# Patient Record
Sex: Female | Born: 1955 | Hispanic: Yes | Marital: Married | Smoking: Former smoker
Health system: Southern US, Community
[De-identification: ages and names within clinical notes are randomized; demographics above are authoritative.]

## PROBLEM LIST (undated history)

## (undated) DIAGNOSIS — K219 Gastro-esophageal reflux disease without esophagitis: Secondary | ICD-10-CM

## (undated) DIAGNOSIS — T7840XA Allergy, unspecified, initial encounter: Secondary | ICD-10-CM

## (undated) DIAGNOSIS — F419 Anxiety disorder, unspecified: Secondary | ICD-10-CM

## (undated) DIAGNOSIS — J45909 Unspecified asthma, uncomplicated: Secondary | ICD-10-CM

## (undated) DIAGNOSIS — I10 Essential (primary) hypertension: Secondary | ICD-10-CM

## (undated) HISTORY — DX: Allergy, unspecified, initial encounter: T78.40XA

## (undated) HISTORY — PX: TONSILLECTOMY: SUR1361

## (undated) HISTORY — PX: APPENDECTOMY: SHX54

## (undated) HISTORY — PX: HYSTERECTOMY ABDOMINAL WITH SALPINGECTOMY: SHX6725

## (undated) HISTORY — DX: Gastro-esophageal reflux disease without esophagitis: K21.9

## (undated) HISTORY — DX: Unspecified asthma, uncomplicated: J45.909

---

## 2018-03-16 LAB — HM COLONOSCOPY

## 2018-05-11 DIAGNOSIS — D649 Anemia, unspecified: Secondary | ICD-10-CM | POA: Insufficient documentation

## 2018-05-11 DIAGNOSIS — G4700 Insomnia, unspecified: Secondary | ICD-10-CM | POA: Insufficient documentation

## 2018-05-11 DIAGNOSIS — I10 Essential (primary) hypertension: Secondary | ICD-10-CM | POA: Insufficient documentation

## 2018-05-11 DIAGNOSIS — K8500 Idiopathic acute pancreatitis without necrosis or infection: Secondary | ICD-10-CM | POA: Insufficient documentation

## 2020-06-12 ENCOUNTER — Other Ambulatory Visit: Payer: Self-pay | Admitting: Physician Assistant

## 2020-06-12 DIAGNOSIS — Z8719 Personal history of other diseases of the digestive system: Secondary | ICD-10-CM

## 2020-06-25 ENCOUNTER — Ambulatory Visit
Admission: RE | Admit: 2020-06-25 | Discharge: 2020-06-25 | Disposition: A | Discharge status: Home / Self Care | Payer: 59 | Source: Ambulatory Visit | Attending: Physician Assistant | Admitting: Physician Assistant

## 2020-06-25 DIAGNOSIS — Z8719 Personal history of other diseases of the digestive system: Secondary | ICD-10-CM

## 2020-06-25 MED ORDER — IOPAMIDOL (ISOVUE-300) INJECTION 61%
100.0000 mL | Freq: Once | INTRAVENOUS | Status: AC | PRN
  Administered 2020-06-25: 100 mL via INTRAVENOUS

## 2020-12-10 ENCOUNTER — Other Ambulatory Visit: Payer: Self-pay

## 2020-12-10 ENCOUNTER — Encounter (HOSPITAL_COMMUNITY): Payer: Self-pay

## 2020-12-10 ENCOUNTER — Ambulatory Visit (HOSPITAL_COMMUNITY)
Admission: EM | Admit: 2020-12-10 | Discharge: 2020-12-10 | Disposition: A | Discharge status: Home / Self Care | Payer: 59 | Attending: Family Medicine | Admitting: Family Medicine

## 2020-12-10 DIAGNOSIS — I10 Essential (primary) hypertension: Secondary | ICD-10-CM

## 2020-12-10 DIAGNOSIS — Z760 Encounter for issue of repeat prescription: Secondary | ICD-10-CM

## 2020-12-10 HISTORY — DX: Anxiety disorder, unspecified: F41.9

## 2020-12-10 HISTORY — DX: Essential (primary) hypertension: I10

## 2020-12-10 MED ORDER — LOSARTAN POTASSIUM-HCTZ 50-12.5 MG PO TABS: 1.0000 | ORAL_TABLET | Freq: Every day | ORAL | 1 refills | Status: DC

## 2020-12-10 NOTE — ED Triage Notes (Signed)
Pt presents for medication refill:   -Losartan-hydrochlorothiazide 50-12.5 mg   Pt is looking for a PCP.

## 2020-12-10 NOTE — ED Provider Notes (Addendum)
[facility]    CSN: [id number] Arrival date & time: 12/10/20  1340      History   Chief Complaint Chief Complaint  Patient presents with  . Medication Refill    HPI Cheryl Weber is a 65 y.o. female.   HPI  Patient is here for medicine refill.  She is on hydrochlorothiazide, losartan.  This works well for her.  Her blood pressure has been well controlled..  She feels well.  No headache or dizzy spells.  No visual symptoms.  No chest pain or heart disease.  She would also like the name of primary care Dr.  taking new patients. Patient is Spanish-speaking.  Seen with Spanish interpreter   Past Medical History:  Diagnosis Date  . Anxiety   . Hypertension     There are no problems to display for this patient.   Past Surgical History:  Procedure Laterality Date  . APPENDECTOMY    . CESAREAN SECTION    . HYSTERECTOMY ABDOMINAL WITH SALPINGECTOMY    . TONSILLECTOMY      OB History    Gravida  3   Para  3   Term  3   Preterm      AB  0   Living  3     SAB      IAB      Ectopic      Multiple      Live Births               Home Medications    Prior to Admission medications   Medication Sig Start Date End Date Taking? Authorizing Provider  sertraline (ZOLOFT) 25 MG tablet Take by mouth. 10/15/20 10/15/21 Yes [provider]  losartan-hydrochlorothiazide (HYZAAR) 50-12.5 MG tablet Take 1 tablet by mouth daily. 12/10/20 02/08/21  Eustace Moore, MD    Family History History reviewed. No pertinent family history.  Social History Social History   Tobacco Use  . Smoking status: Former Smoker    Quit date: 12/10/1992    Years since quitting: 28.0  . Smokeless tobacco: Never Used  Substance Use Topics  . Alcohol use: Yes  . Drug use: Never     Allergies   Lactase   Review of Systems Review of Systems  See HPI Physical Exam Triage Vital Signs   BP (!) 169/85 (BP Location: Right Arm)   Pulse 67    Temp 97.7 F (36.5 C) (Oral)   Resp 16   SpO2 98%       Physical Exam Constitutional:      General: She is not in acute distress.    Appearance: Normal appearance. She is well-developed, normal weight and well-nourished.  HENT:     Head: Normocephalic and atraumatic.     Mouth/Throat:     Mouth: Oropharynx is clear and moist.     Comments: Mask is in place Eyes:     Conjunctiva/sclera: Conjunctivae normal.     Pupils: Pupils are equal, round, and reactive to light.  Cardiovascular:     Rate and Rhythm: Normal rate and regular rhythm.     Heart sounds: Normal heart sounds.  Pulmonary:     Effort: Pulmonary effort is normal. No respiratory distress.     Breath sounds: Normal breath sounds.  Abdominal:     General: There is no distension.     Palpations: Abdomen is soft.  Musculoskeletal:        General: No edema. Normal range  of motion.     Cervical back: Normal range of motion.  Skin:    General: Skin is warm and dry.  Neurological:     Mental Status: She is alert.  Psychiatric:        Behavior: Behavior normal.      UC Treatments / Results  Labs (all labs ordered are listed, but only abnormal results are displayed) Labs Reviewed - No data to display  EKG   Radiology No results found.  Procedures Procedures (including critical care time)  Medications Ordered in UC Medications - No data to display  Initial Impression / Assessment and Plan / UC Course  I have reviewed the triage vital signs and the nursing notes.  Pertinent labs & imaging results that were available during my care of the patient were reviewed by me and considered in my medical decision making (see chart for details).     Medicine is refilled.  Referral of her PCP is given.  Return as needed Final Clinical Impressions(s) / UC Diagnoses   Final diagnoses:  Essential hypertension  Encounter for medication refill     Discharge Instructions     Take blood pressure medicine 1 pill a  day  Call internal medicine or family practice for a primary care doctor    ED Prescriptions    Medication Sig Dispense Auth. Provider   losartan-hydrochlorothiazide (HYZAAR) 50-12.5 MG tablet Take 1 tablet by mouth daily. 30 tablet Eustace Moore, MD     PDMP not reviewed this encounter.   Eustace Moore, MD 12/10/20 1724    Eustace Moore, MD 12/10/20 [phone]

## 2020-12-10 NOTE — Discharge Instructions (Addendum)
Take blood pressure medicine 1 pill a day  Call internal medicine or family practice for a primary care doctor

## 2021-01-28 ENCOUNTER — Other Ambulatory Visit: Payer: Self-pay

## 2021-01-28 ENCOUNTER — Encounter: Payer: Self-pay | Admitting: Physician Assistant

## 2021-01-28 ENCOUNTER — Ambulatory Visit (INDEPENDENT_AMBULATORY_CARE_PROVIDER_SITE_OTHER): Payer: 59

## 2021-01-28 ENCOUNTER — Ambulatory Visit (INDEPENDENT_AMBULATORY_CARE_PROVIDER_SITE_OTHER): Payer: 59 | Admitting: Physician Assistant

## 2021-01-28 VITALS — BP 150/100 | HR 75 | Temp 97.6°F | Ht 65.0 in | Wt 171.4 lb

## 2021-01-28 DIAGNOSIS — D649 Anemia, unspecified: Secondary | ICD-10-CM

## 2021-01-28 DIAGNOSIS — R918 Other nonspecific abnormal finding of lung field: Secondary | ICD-10-CM | POA: Diagnosis not present

## 2021-01-28 DIAGNOSIS — K219 Gastro-esophageal reflux disease without esophagitis: Secondary | ICD-10-CM | POA: Insufficient documentation

## 2021-01-28 DIAGNOSIS — R059 Cough, unspecified: Secondary | ICD-10-CM

## 2021-01-28 DIAGNOSIS — J4520 Mild intermittent asthma, uncomplicated: Secondary | ICD-10-CM | POA: Diagnosis not present

## 2021-01-28 DIAGNOSIS — J4540 Moderate persistent asthma, uncomplicated: Secondary | ICD-10-CM

## 2021-01-28 DIAGNOSIS — F419 Anxiety disorder, unspecified: Secondary | ICD-10-CM | POA: Diagnosis not present

## 2021-01-28 DIAGNOSIS — Z8619 Personal history of other infectious and parasitic diseases: Secondary | ICD-10-CM | POA: Insufficient documentation

## 2021-01-28 DIAGNOSIS — I10 Essential (primary) hypertension: Secondary | ICD-10-CM

## 2021-01-28 LAB — CBC WITH DIFFERENTIAL/PLATELET
Basophils Absolute: 0 10*3/uL (ref 0.0–0.1)
Basophils Relative: 0.7 % (ref 0.0–3.0)
Eosinophils Absolute: 0.1 10*3/uL (ref 0.0–0.7)
Eosinophils Relative: 1.3 % (ref 0.0–5.0)
HCT: 35.5 % — ABNORMAL LOW (ref 36.0–46.0)
Hemoglobin: 11.9 g/dL — ABNORMAL LOW (ref 12.0–15.0)
Lymphocytes Relative: 43.6 % (ref 12.0–46.0)
Lymphs Abs: 2.4 10*3/uL (ref 0.7–4.0)
MCHC: 33.5 g/dL (ref 30.0–36.0)
MCV: 78 fl (ref 78.0–100.0)
Monocytes Absolute: 0.7 10*3/uL (ref 0.1–1.0)
Monocytes Relative: 12.4 % — ABNORMAL HIGH (ref 3.0–12.0)
Neutro Abs: 2.3 10*3/uL (ref 1.4–7.7)
Neutrophils Relative %: 42 % — ABNORMAL LOW (ref 43.0–77.0)
Platelets: 318 10*3/uL (ref 150.0–400.0)
RBC: 4.55 Mil/uL (ref 3.87–5.11)
RDW: 15 % (ref 11.5–15.5)
WBC: 5.5 10*3/uL (ref 4.0–10.5)

## 2021-01-28 LAB — LIPID PANEL
Cholesterol: 241 mg/dL — ABNORMAL HIGH (ref 0–200)
HDL: 80.1 mg/dL (ref >39.00)
LDL Cholesterol: 136 mg/dL — ABNORMAL HIGH (ref 0–99)
NonHDL: 160.51
Total CHOL/HDL Ratio: 3
Triglycerides: 122 mg/dL (ref 0.0–149.0)
VLDL: 24.4 mg/dL (ref 0.0–40.0)

## 2021-01-28 LAB — COMPREHENSIVE METABOLIC PANEL
ALT: 14 U/L (ref 0–35)
AST: 20 U/L (ref 0–37)
Albumin: 4.3 g/dL (ref 3.5–5.2)
Alkaline Phosphatase: 105 U/L (ref 39–117)
BUN: 10 mg/dL (ref 6–23)
CO2: 28 mEq/L (ref 19–32)
Calcium: 9.7 mg/dL (ref 8.4–10.5)
Chloride: 96 mEq/L (ref 96–112)
Creatinine, Ser: 0.63 mg/dL (ref 0.40–1.20)
GFR: 94.24 mL/min (ref >60.00)
Glucose, Bld: 79 mg/dL (ref 70–99)
Potassium: 3.6 mEq/L (ref 3.5–5.1)
Sodium: 131 mEq/L — ABNORMAL LOW (ref 135–145)
Total Bilirubin: 0.4 mg/dL (ref 0.2–1.2)
Total Protein: 7.5 g/dL (ref 6.0–8.3)

## 2021-01-28 LAB — VITAMIN B12: Vitamin B-12: 237 pg/mL (ref 211–911)

## 2021-01-28 LAB — IRON: Iron: 88 ug/dL (ref 42–145)

## 2021-01-28 LAB — FERRITIN: Ferritin: 41.3 ng/mL (ref 10.0–291.0)

## 2021-01-28 MED ORDER — LOSARTAN POTASSIUM 50 MG PO TABS: 50.0000 mg | ORAL_TABLET | Freq: Every day | ORAL | 1 refills | Status: DC

## 2021-01-28 MED ORDER — HYDROCHLOROTHIAZIDE 12.5 MG PO TABS: 12.5000 mg | ORAL_TABLET | Freq: Every day | ORAL | 1 refills | Status: DC

## 2021-01-28 NOTE — Progress Notes (Signed)
Cheryl Weber de [name] is a 65 y.o. female is here to establish care.  I acted as a Neurosurgeon for [employer], PA-C Corky Mull, LPN   History of Present Illness:   Chief Complaint  Patient presents with  . Establish Care  . Asthma    HPI  Pt is here to establish care today. She is with Nile Riggs, in-person interpreter.  Asthma; Lung Nodule; Cough Pt has hx of asthma as a child. Presently not having any issues. Pt says when she is sleeping she has been coughing a lot at night for the past 4 months. Dry cough. No unintentional weight loss, is losing weight due to increased activity. Lost about 10 lb in two months. In the DR, a long time ago she was told that she had a spot on one of her lungs. Has not had any imaging of this anytime recently or documentation of this. Negative TB test in the past. Would have chest xrays for this, denies every having a CT scan, 2016 was the last time this was evaluated. She is not having night sweats or coughing up blood. Does have hx of reflux, and takes prilosec as needed. Has an albuterol inhaler that she uses at night prn.  Smoked for 5 years, about 1 cigarette per day, quit in 1987.  Takes zyrtec 10 mg only prn.   HTN Currently taking Losartan 50 mg and HCTZ 12.5 mg. She does have a blood pressure monitor at home but doesn't use it.  Patient denies chest pain, SOB, blurred vision, dizziness, unusual headaches, lower leg swelling. Patient is compliant with medication. Denies excessive caffeine intake, stimulant usage, excessive alcohol intake, or increase in salt consumption.  BP Readings from Last 3 Encounters:  01/28/21 (!) 150/100  12/10/20 (!) 169/85    Anxiety Ran out of her zoloft 25 mg about 3 months ago. She feels like her symptoms were pretty situational and she does not need this medication at this time. Denies SI/HI.   Anemia Reports hx of iron deficiency. Was instructed to take oral iron regular and she takes this  (Floradix) liquid supplementation.     Health Maintenance Due  Topic Date Due  . Hepatitis C Screening  Never done  . HIV Screening  Never done  . TETANUS/TDAP  Never done  . PAP SMEAR-Modifier  Never done  . COLONOSCOPY (Pts 45-61yrs Insurance coverage will need to be confirmed)  Never done  . MAMMOGRAM  Never done  . INFLUENZA VACCINE  07/01/2020    Past Medical History:  Diagnosis Date  . Allergy   . Anxiety   . Asthma   . GERD (gastroesophageal reflux disease)   . Hypertension   . Vaginal delivery 1968, 1981     Social History   Tobacco Use  . Smoking status: Former Games developer  . Smokeless tobacco: Never Used  . Tobacco comment: quit 1987  Vaping Use  . Vaping Use: Never used  Substance Use Topics  . Alcohol use: Yes    Alcohol/week: 4.0 - 5.0 standard drinks    Types: 4 - 5 Glasses of wine per week  . Drug use: Never    Past Surgical History:  Procedure Laterality Date  . APPENDECTOMY    . CESAREAN SECTION  1985  . HYSTERECTOMY ABDOMINAL WITH SALPINGECTOMY    . TONSILLECTOMY      Family History  Problem Relation Age of Onset  . Lung cancer Father   . Cirrhosis Father   .  Alcohol abuse Father     PMHx, SurgHx, SocialHx, FamHx, Medications, and Allergies were reviewed in the Visit Navigator and updated as appropriate.   Patient Active Problem List   Diagnosis Date Noted  . Gastroesophageal reflux disease 01/28/2021  . History of Helicobacter pylori infection 01/28/2021  . Anemia 05/11/2018  . Hypertension 05/11/2018  . Idiopathic acute pancreatitis 05/11/2018  . Insomnia 05/11/2018    Social History   Tobacco Use  . Smoking status: Former Games developer  . Smokeless tobacco: Never Used  . Tobacco comment: quit 1987  Vaping Use  . Vaping Use: Never used  Substance Use Topics  . Alcohol use: Yes    Alcohol/week: 4.0 - 5.0 standard drinks    Types: 4 - 5 Glasses of wine per week  . Drug use: Never    Current Medications and Allergies:     Current Outpatient Medications:  .  albuterol (VENTOLIN HFA) 108 (90 Base) MCG/ACT inhaler, 2 puffs, Disp: , Rfl:  .  cetirizine (ZYRTEC) 10 MG tablet, 1 tablet, Disp: , Rfl:  .  Cholecalciferol (VITAMIN D3) 50 MCG (2000 UT) CAPS, Take 1 capsule by mouth daily in the afternoon., Disp: , Rfl:  .  Doxylamine Succinate, Sleep, (SLEEP AID PO), Take 1 tablet by mouth at bedtime as needed., Disp: , Rfl:  .  Magnesium 500 MG CAPS, Take 1 capsule by mouth daily in the afternoon., Disp: , Rfl:  .  MELATONIN PO, Take 1 capsule by mouth at bedtime as needed., Disp: , Rfl:  .  omeprazole (PRILOSEC) 20 MG capsule, Take by mouth., Disp: , Rfl:  .  hydrochlorothiazide (HYDRODIURIL) 12.5 MG tablet, Take 1 tablet (12.5 mg total) by mouth daily., Disp: 90 tablet, Rfl: 1 .  losartan (COZAAR) 50 MG tablet, Take 1 tablet (50 mg total) by mouth daily., Disp: 90 tablet, Rfl: 1   Allergies  Allergen Reactions  . Gluten Meal Other (See Comments)  . Lactase Diarrhea  . Other Other (See Comments)  . Pork Allergy Diarrhea    Review of Systems   ROS Negative unless otherwise specified per HPI.  Vitals:   Vitals:   01/28/21 1036  BP: (!) 150/100  Pulse: 75  Temp: 97.6 F (36.4 C)  TempSrc: Temporal  SpO2: 97%  Weight: 171 lb 6.1 oz (77.7 kg)  Height: 5\' 5"  (1.651 m)     Body mass index is 28.52 kg/m.   Physical Exam:    Physical Exam Vitals and nursing note reviewed.  Constitutional:      General: She is not in acute distress.    Appearance: She is well-developed. She is not ill-appearing, toxic-appearing or sickly-appearing.  Cardiovascular:     Rate and Rhythm: Normal rate and regular rhythm.     Pulses: Normal pulses.     Heart sounds: Normal heart sounds, S1 normal and S2 normal.     Comments: No LE edema Pulmonary:     Effort: Pulmonary effort is normal.     Breath sounds: Normal breath sounds.  Skin:    General: Skin is warm, dry and intact.  Neurological:     Mental Status:  She is alert.     GCS: GCS eye subscore is 4. GCS verbal subscore is 5. GCS motor subscore is 6.  Psychiatric:        Mood and Affect: Mood and affect normal.        Speech: Speech normal.        Behavior: Behavior normal. Behavior is cooperative.  Assessment and Plan:    Carmie was seen today for establish care and asthma.  Diagnoses and all orders for this visit:  Primary hypertension Above goal in office today. Will continue regimen and have her keep a log for [place] as I have no other readings on her and she states that she also has hx of hypotension. Refill losartan 50 mg and hctz 12.5 mg daily. Follow-up in 1 month, sooner if concerns. -     Comprehensive metabolic panel -     Lipid panel  Anxiety Well controlled. No needs identified at this time. Follow-up as needed.  Mild intermittent asthma without complication Taking albuterol prn. Follow-up as needed. -     DG Chest 2 View; Future  X-ray of lung, abnormal; Cough No red flags on discussion. Update blood work today and also xray per patient request. I do not have records of prior lung abnormalities so I do not have baseline to compare this to. If imaging WNL, consider daily priliosec and zyrtec to see if this helps her symptoms vs referral to pulm.  Anemia, unspecified type Update blood work today and further evaluate. -     CBC with Differential/Platelet -     Vitamin B12 -     Ferritin -     Iron  Other orders -     losartan (COZAAR) 50 MG tablet; Take 1 tablet (50 mg total) by mouth daily. -     hydrochlorothiazide (HYDRODIURIL) 12.5 MG tablet; Take 1 tablet (12.5 mg total) by mouth daily.   CMA or LPN served as scribe during this visit. History, Physical, and Plan performed by medical provider. The above documentation has been reviewed and is accurate and complete.  Time spent with patient today was 45 minutes which consisted of chart review, discussing diagnosis, work up, treatment answering  questions and documentation.   Jarold Motto, PA-C [place], [place] 01/28/2021  Follow-up: No follow-ups on file.

## 2021-01-28 NOTE — Patient Instructions (Addendum)
It was great to see you!  For your blood pressure: Please record your blood pressure a few days a week for [place]. No changes today. Continue taking them at this time. Follow-up in 1 month so we can review your blood pressures with you and check again in the office.  For your anxiety: I agree with holding your zoloft (sertraline for now)  For your cough: Xray today  For your anemia: We will update your blood work and provide feedback after blood work has returned. You may continue your over the counter supplement for now.  Let's follow-up in 1 month, sooner if you have concerns.  If a referral was placed today, you will be contacted for an appointment. Please note that routine referrals can sometimes take up to 3-4 weeks to process. Please call our office if you haven't heard anything after this time frame.  Take care,  Jarold Motto PA-C    Cmo tomarse la presin arterial How to Take Your Blood Pressure La presin arterial es la medida de la fuerza de la sangre al presionar contra las paredes de las arterias. Las arterias son los vasos sanguneos que transportan la sangre desde el corazn hacia todas las partes del cuerpo. Usted puede tomar su presin arterial en casa con un aparato. Es posible que tenga que tomar su presin arterial en casa:  Para ver si tiene presin arterial elevada (hipertensin).  Para controlar su presin arterial a lo largo del tiempo.  Para asegurarse de que el medicamento para la presin arterial est surtiendo Engineer, mining. Materiales necesarios:  Aparato de medicin de la presin arterial o tensimetro.  Silla de comedor para sentarse.  Mesa o escritorio.  Cuaderno pequeo.  Lpiz o bolgrafo. Cmo prepararse Evite realizar lo siguiente durante los 30 minutos anteriores a Chief Operating Officer su presin arterial:  Consumir bebidas con cafena, como caf o t.  Consumir alcohol.  Comer.  Fumar.  Realizar actividad fsica. Haga lo siguiente cinco  minutos antes de controlarse la presin arterial:  Vaya al bao y haga pis (orine).  Sintese en una silla de comedor. No se siente en un silln blando o sof.  Est tranquilo. No hable. Cmo tomarse la presin arterial Siga las instrucciones que vienen con el aparato. Si tiene Ambulance person, las instrucciones podran ser las siguientes: 1. Sintese con la espalda recta. 2. Coloque los pies en el piso. No cruce los tobillos ni las piernas. 3. Apoye el brazo izquierdo al nivel del corazn. Puede apoyarlo en una mesa, escritorio o silla. 4. Arremnguese. 5. Envuelva la parte superior de su brazo izquierdo con el brazalete. El brazalete debe estar 1 pulgada (2,5 cm) sobre su codo. Es mejor Optometrist brazalete alrededor de la piel [place]. 6. Ajuste el brazalete alrededor de su brazo. Debe poder meter nicamente un dedo entre el brazalete y Cabin crew. 7. Coloque el cordn de modo que quede apoyado en el pliegue del codo. 8. Presione el botn de encendido. 9. Qudese sentado tranquilamente mientras el brazalete se infla y se desinfla. 10. Escriba los nmeros que se muestran en la pantalla. 11. Espere 2o 3 minutos y repita los pasos 1al10.   Qu significan los nmeros? Dos nmeros conforman la presin arterial. El primer nmero es la presin sistlica. El segundo nmero es la presin diastlica. Un ejemplo de lectura de presin arterial sera "120 sobre 80" (o 120/80). Si es adulto y no tiene Therapist, music, use esta gua para saber si su presin arterial es normal: Normal  Primer nmero: debajo de 120.  Segundo nmero: debajo de 80. Elevada  Primer nmero: 120-129.  Segundo nmero: debajo de 80. Etapa 1 de hipertensin  Primer nmero: 130-139.  Segundo nmero: 80-89. Etapa 2 de hipertensin  Primer nmero: 140 o ms.  Segundo nmero: 90 o ms. Su presin arterial se encuentra por encima del nivel normal incluso si nicamente el nmero superior o el inferior es  mayor de lo normal. Siga estas instrucciones en su casa:  Controle su presin arterial con la frecuencia que le indique su mdico.  Contrlese la presin arterial a la misma hora todos los [place].  Lleve el tensimetro a su prxima cita con el mdico. Su mdico: ? Se asegurar de que lo est usando correctamente. ? Se asegurar de que funcione bien.  Se asegurar de que entienda cules deben ser sus nmeros de presin arterial.  Dgale al mdico si los medicamentos que toma le causan efectos secundarios.  Concurra a todas las visitas de [address] se lo haya indicado el mdico. Esto es importante. Consejos generales:  Necesitar un aparato de medicin de la presin arterial o tensimetro. Su mdico puede sugerirle un tensimetro. Puede comprar uno en una farmacia o en lnea. Al escoger uno: ? Escoja uno que tenga un brazalete. ? Escoja uno que se envuelva en la parte superior de su brazo. Debe poder meter nicamente un dedo entre el brazalete y Cabin crew. ? No escoja uno que mida su presin arterial en la mueca o el dedo. Dnde buscar ms informacin American Heart Association (Asociacin Estadounidense del Corazn): www.heart.org Comunquese con un mdico si:  Su presin arterial sigue alta. Solicite ayuda de inmediato si:  Su primer nmero de presin arterial es ms alto que 180.  Su segundo nmero de presin arterial es ms alto que 120. Resumen  Contrlese la presin arterial a la [employer].  Evite la cafena, el alcohol, fumar y hacer actividad fsica durante los 30 minutos anteriores a controlarse la presin arterial.  Se asegurar de que entienda cules deben ser sus nmeros de presin arterial. Esta informacin no tiene Theme park manager el consejo del mdico. Asegrese de hacerle al mdico cualquier pregunta que tenga. Document Revised: 12/26/2019 Document Reviewed: 12/26/2019 Elsevier Patient Education  2021 [employer].

## 2021-01-29 ENCOUNTER — Encounter: Payer: Self-pay | Admitting: Physician Assistant

## 2021-02-01 ENCOUNTER — Other Ambulatory Visit: Payer: Self-pay | Admitting: *Deleted

## 2021-02-01 ENCOUNTER — Encounter: Payer: Self-pay | Admitting: Physician Assistant

## 2021-02-01 MED ORDER — LOSARTAN POTASSIUM 100 MG PO TABS: 100.0000 mg | ORAL_TABLET | Freq: Every day | ORAL | 1 refills | Status: DC

## 2021-02-11 ENCOUNTER — Ambulatory Visit: Payer: 59 | Admitting: Physician Assistant

## 2021-02-26 ENCOUNTER — Other Ambulatory Visit: Payer: Self-pay

## 2021-02-26 ENCOUNTER — Ambulatory Visit (INDEPENDENT_AMBULATORY_CARE_PROVIDER_SITE_OTHER): Payer: 59 | Admitting: Physician Assistant

## 2021-02-26 ENCOUNTER — Encounter: Payer: Self-pay | Admitting: Physician Assistant

## 2021-02-26 VITALS — BP 142/86 | HR 74 | Temp 97.7°F | Ht 67.0 in | Wt 172.2 lb

## 2021-02-26 DIAGNOSIS — I10 Essential (primary) hypertension: Secondary | ICD-10-CM | POA: Diagnosis not present

## 2021-02-26 LAB — BASIC METABOLIC PANEL
BUN: 14 mg/dL (ref 6–23)
CO2: 30 mEq/L (ref 19–32)
Calcium: 9.8 mg/dL (ref 8.4–10.5)
Chloride: 98 mEq/L (ref 96–112)
Creatinine, Ser: 0.64 mg/dL (ref 0.40–1.20)
GFR: 93.83 mL/min (ref >60.00)
Glucose, Bld: 78 mg/dL (ref 70–99)
Potassium: 3.9 mEq/L (ref 3.5–5.1)
Sodium: 134 mEq/L — ABNORMAL LOW (ref 135–145)

## 2021-02-26 MED ORDER — AMLODIPINE BESYLATE 5 MG PO TABS: 5.0000 mg | ORAL_TABLET | Freq: Every day | ORAL | 1 refills | Status: DC

## 2021-02-26 NOTE — Progress Notes (Signed)
Cheryl Weber de Cheryl Weber is a 65 y.o. female here for a follow up of a pre-existing problem.  History of Present Illness:   Chief Complaint  Patient presents with  . Hypertension    Has taken medication today, did not check blood pressure today, Brought readings with her, are not daily readings.    HPI   She is with in person interpreter today.  HTN Currently taking Losartan 100 mg daily. We stopped HCTZ due to low sodium. At home blood pressure readings are: 140-150/80-90. Patient denies chest pain, SOB, blurred vision, dizziness, unusual headaches, lower leg swelling. Patient is compliant with medication. Denies excessive caffeine intake, stimulant usage, excessive alcohol intake, or increase in salt consumption.  BP Readings from Last 3 Encounters:  02/26/21 (!) 142/86  01/28/21 (!) 150/100  12/10/20 (!) 169/85   Wt Readings from Last 5 Encounters:  02/26/21 172 lb 3.2 oz (78.1 kg)  01/28/21 171 lb 6.1 oz (77.7 kg)     Past Medical History:  Diagnosis Date  . Allergy   . Anxiety   . Asthma   . GERD (gastroesophageal reflux disease)   . Hypertension   . Vaginal delivery 1968, 1981     Social History   Tobacco Use  . Smoking status: Former Games developer  . Smokeless tobacco: Never Used  . Tobacco comment: quit 1987  Vaping Use  . Vaping Use: Never used  Substance Use Topics  . Alcohol use: Yes    Alcohol/week: 4.0 - 5.0 standard drinks    Types: 4 - 5 Glasses of wine per week  . Drug use: Never    Past Surgical History:  Procedure Laterality Date  . APPENDECTOMY    . CESAREAN SECTION  1985  . HYSTERECTOMY ABDOMINAL WITH SALPINGECTOMY    . TONSILLECTOMY      Family History  Problem Relation Age of Onset  . Lung cancer Father   . Cirrhosis Father   . Alcohol abuse Father     Allergies  Allergen Reactions  . Gluten Meal Other (See Comments)  . Lactase Diarrhea  . Other Other (See Comments)  . Pork Allergy Diarrhea    Current Medications:    Current Outpatient Medications:  .  albuterol (VENTOLIN HFA) 108 (90 Base) MCG/ACT inhaler, 2 puffs, Disp: , Rfl:  .  amLODipine (NORVASC) 5 MG tablet, Take 1 tablet (5 mg total) by mouth daily., Disp: 30 tablet, Rfl: 1 .  cetirizine (ZYRTEC) 10 MG tablet, 1 tablet, Disp: , Rfl:  .  Cholecalciferol (VITAMIN D3) 50 MCG (2000 UT) CAPS, Take 1 capsule by mouth daily in the afternoon., Disp: , Rfl:  .  Doxylamine Succinate, Sleep, (SLEEP AID PO), Take 1 tablet by mouth at bedtime as needed., Disp: , Rfl:  .  losartan (COZAAR) 100 MG tablet, Take 1 tablet (100 mg total) by mouth daily., Disp: 90 tablet, Rfl: 1 .  Magnesium 500 MG CAPS, Take 1 capsule by mouth daily in the afternoon., Disp: , Rfl:  .  MELATONIN PO, Take 1 capsule by mouth at bedtime as needed., Disp: , Rfl:  .  omeprazole (PRILOSEC) 20 MG capsule, Take by mouth., Disp: , Rfl:  .  fluticasone (FLONASE) 50 MCG/ACT nasal spray, Place 1 spray into both nostrils daily., Disp: , Rfl:    Review of Systems:   ROS Negative unless otherwise specified per HPI.  Vitals:   Vitals:   02/26/21 1047  BP: (!) 142/86  Pulse: 74  Temp: 97.7 F (36.5 C)  TempSrc: Temporal  SpO2: 98%  Weight: 172 lb 3.2 oz (78.1 kg)  Height: 5\' 7"  (1.702 m)     Body mass index is 26.97 kg/m.  Physical Exam:   Physical Exam Vitals and nursing note reviewed.  Constitutional:      General: She is not in acute distress.    Appearance: She is well-developed. She is not ill-appearing or toxic-appearing.  Cardiovascular:     Rate and Rhythm: Normal rate and regular rhythm.     Pulses: Normal pulses.     Heart sounds: Normal heart sounds, S1 normal and S2 normal.     Comments: No LE edema Pulmonary:     Effort: Pulmonary effort is normal.     Breath sounds: Normal breath sounds.  Skin:    General: Skin is warm and dry.  Neurological:     Mental Status: She is alert.     GCS: GCS eye subscore is 4. GCS verbal subscore is 5. GCS motor subscore is  6.  Psychiatric:        Speech: Speech normal.        Behavior: Behavior normal. Behavior is cooperative.     Assessment and Plan:   Caliann was seen today for hypertension.  Diagnoses and all orders for this visit:  Primary hypertension Remains above goal Continue losartan 100 mg daily Add norvasc 5 mg daily Update blood work today to recheck sodium Recheck in 1 month, sooner if concerns -     Basic metabolic panel  Other orders -     amLODipine (NORVASC) 5 MG tablet; Take 1 tablet (5 mg total) by mouth daily.   Kandis Cocking, PA-C

## 2021-02-26 NOTE — Patient Instructions (Signed)
It was great to see you!  Please let me know after about a month how your blood pressure is doing with the new medication. You can call is to give [place] an update or schedule follow-up in 1 month.  Continue Losartan 100 mg daily. Start norvasc 5 mg daily.  Goal BP is <140/80  Take care,  Jarold Motto PA-C

## 2021-03-23 ENCOUNTER — Other Ambulatory Visit: Payer: Self-pay | Admitting: Physician Assistant

## 2021-04-02 ENCOUNTER — Encounter: Payer: Self-pay | Admitting: Physician Assistant

## 2021-04-02 ENCOUNTER — Other Ambulatory Visit: Payer: Self-pay

## 2021-04-02 ENCOUNTER — Ambulatory Visit: Payer: 59

## 2021-04-02 ENCOUNTER — Encounter: Payer: Self-pay | Admitting: *Deleted

## 2021-04-02 ENCOUNTER — Ambulatory Visit (INDEPENDENT_AMBULATORY_CARE_PROVIDER_SITE_OTHER): Payer: 59 | Admitting: Physician Assistant

## 2021-04-02 VITALS — BP 130/80 | HR 71 | Temp 97.8°F | Ht 67.0 in | Wt 167.4 lb

## 2021-04-02 DIAGNOSIS — I10 Essential (primary) hypertension: Secondary | ICD-10-CM | POA: Diagnosis not present

## 2021-04-02 DIAGNOSIS — R42 Dizziness and giddiness: Secondary | ICD-10-CM

## 2021-04-02 LAB — COMPREHENSIVE METABOLIC PANEL
ALT: 17 U/L (ref 0–35)
AST: 21 U/L (ref 0–37)
Albumin: 4.3 g/dL (ref 3.5–5.2)
Alkaline Phosphatase: 98 U/L (ref 39–117)
BUN: 14 mg/dL (ref 6–23)
CO2: 31 mEq/L (ref 19–32)
Calcium: 9.8 mg/dL (ref 8.4–10.5)
Chloride: 99 mEq/L (ref 96–112)
Creatinine, Ser: 0.67 mg/dL (ref 0.40–1.20)
GFR: 92.74 mL/min (ref >60.00)
Glucose, Bld: 69 mg/dL — ABNORMAL LOW (ref 70–99)
Potassium: 4.2 mEq/L (ref 3.5–5.1)
Sodium: 136 mEq/L (ref 135–145)
Total Bilirubin: 0.4 mg/dL (ref 0.2–1.2)
Total Protein: 6.9 g/dL (ref 6.0–8.3)

## 2021-04-02 LAB — HEMOGLOBIN A1C: Hgb A1c MFr Bld: 5.9 % (ref 4.6–6.5)

## 2021-04-02 NOTE — Patient Instructions (Signed)
It was great to see you!  EKG looks overall normal.  I'm going to update some blood work today.  We are going to order some more tests:  Echocardiogram (ultrasound of your heart)  Carotid ultrasound (ultrasound of your arteries in your neck)  Holter monitor (continuous heart monitor that you will wear for two weeks)  MRI of brain  Any worsening symptoms in the meantime, please go to the ER  You will be contacted about scheduling all of these orders.  Take care,  Jarold Motto PA-C

## 2021-04-02 NOTE — Progress Notes (Signed)
Cheryl Weber is a 65 y.o. female is here for follow up.  I acted as a Neurosurgeon for [employer], PA-C Corky Mull, LPN   History of Present Illness:   Chief Complaint  Patient presents with  . Hypertension   Patient is currently here with in-person interpreter.  HPI   Hypertension Currently taking Amlodipine 5 mg daily. Pt has been checking blood pressure at home, systolic 130-149, diastolic 75-89. Pt has not been taking Losartan misunderstood instructions. She has been having some sinus pressure headaches and some dizziness. Pt denies blurred vision, chest pain, SOB or lower leg edema. Denies excessive caffeine intake, stimulant usage, excessive alcohol intake or increase in salt consumption.  15 days after starting the amlodopine she has had some dizziness. At this time she also changed her magnesium supplement because she read that a different type of magnesium may help her heart. The dizziness is a a combination of slight spinning and feeling lightheaded. The episodes last a few seconds and they cause her to feel unstable, walk unbalanced and feel confused. This happened once a year ago and she ignored it, but now states that this is happening a few times each week over the past week.  Denies: chest pain, palpitations  Has family hx of CVA.  Health Maintenance Due  Topic Date Due  . Hepatitis C Screening  Never done  . HIV Screening  Never done  . TETANUS/TDAP  Never done  . PAP SMEAR-Modifier  Never done    Past Medical History:  Diagnosis Date  . Allergy   . Anxiety   . Asthma   . GERD (gastroesophageal reflux disease)   . Hypertension   . Vaginal delivery 1968, 1981     Social History   Tobacco Use  . Smoking status: Former Games developer  . Smokeless tobacco: Never Used  . Tobacco comment: quit 1987  Vaping Use  . Vaping Use: Never used  Substance Use Topics  . Alcohol use: Yes    Alcohol/week: 4.0 - 5.0 standard drinks    Types: 4 - 5  Glasses of wine per week  . Drug use: Never    Past Surgical History:  Procedure Laterality Date  . APPENDECTOMY    . CESAREAN SECTION  1985  . HYSTERECTOMY ABDOMINAL WITH SALPINGECTOMY    . TONSILLECTOMY      Family History  Problem Relation Age of Onset  . Lung cancer Father   . Cirrhosis Father   . Alcohol abuse Father     PMHx, SurgHx, SocialHx, FamHx, Medications, and Allergies were reviewed in the Visit Navigator and updated as appropriate.   Patient Active Problem List   Diagnosis Date Noted  . Gastroesophageal reflux disease 01/28/2021  . History of Helicobacter pylori infection 01/28/2021  . Anemia 05/11/2018  . Hypertension 05/11/2018  . Idiopathic acute pancreatitis 05/11/2018  . Insomnia 05/11/2018    Social History   Tobacco Use  . Smoking status: Former Games developer  . Smokeless tobacco: Never Used  . Tobacco comment: quit 1987  Vaping Use  . Vaping Use: Never used  Substance Use Topics  . Alcohol use: Yes    Alcohol/week: 4.0 - 5.0 standard drinks    Types: 4 - 5 Glasses of wine per week  . Drug use: Never    Current Medications and Allergies:    Current Outpatient Medications:  .  albuterol (VENTOLIN HFA) 108 (90 Base) MCG/ACT inhaler, 2 puffs, Disp: , Rfl:  .  amLODipine (NORVASC)  5 MG tablet, TAKE 1 TABLET (5 MG TOTAL) BY MOUTH DAILY., Disp: 30 tablet, Rfl: 1 .  cetirizine (ZYRTEC) 10 MG tablet, 1 tablet, Disp: , Rfl:  .  Cholecalciferol (VITAMIN D3) 50 MCG (2000 UT) CAPS, Take 1 capsule by mouth daily in the afternoon., Disp: , Rfl:  .  Doxylamine Succinate, Sleep, (SLEEP AID PO), Take 1 tablet by mouth at bedtime as needed., Disp: , Rfl:  .  fluticasone (FLONASE) 50 MCG/ACT nasal spray, Place 1 spray into both nostrils daily., Disp: , Rfl:  .  MAGNESIUM PO, Take 50 mg by mouth daily in the afternoon., Disp: , Rfl:  .  MELATONIN PO, Take 1 capsule by mouth at bedtime as needed., Disp: , Rfl:  .  omeprazole (PRILOSEC) 20 MG capsule, Take by  mouth., Disp: , Rfl:  .  losartan (COZAAR) 100 MG tablet, Take 1 tablet (100 mg total) by mouth daily. (Patient not taking: Reported on 04/02/2021), Disp: 90 tablet, Rfl: 1   Allergies  Allergen Reactions  . Gluten Meal Other (See Comments)  . Lactase Diarrhea  . Other Other (See Comments)  . Pork Allergy Diarrhea    Review of Systems   ROS  Negative unless otherwise specified per HPI.  Vitals:   Vitals:   04/02/21 1023  BP: 130/80  Pulse: 71  Temp: 97.8 F (36.6 C)  TempSrc: Temporal  SpO2: 97%  Weight: 167 lb 6.1 oz (75.9 kg)  Height: 5\' 7"  (1.702 m)     Body mass index is 26.22 kg/m.   Physical Exam:    Physical Exam Vitals and nursing note reviewed.  Constitutional:      General: She is not in acute distress.    Appearance: She is well-developed. She is not ill-appearing or toxic-appearing.  Cardiovascular:     Rate and Rhythm: Normal rate and regular rhythm.     Pulses: Normal pulses.     Heart sounds: Normal heart sounds, S1 normal and S2 normal.     Comments: No LE edema Pulmonary:     Effort: Pulmonary effort is normal.     Breath sounds: Normal breath sounds.  Skin:    General: Skin is warm and dry.  Neurological:     General: No focal deficit present.     Mental Status: She is alert.     GCS: GCS eye subscore is 4. GCS verbal subscore is 5. GCS motor subscore is 6.     Cranial Nerves: Cranial nerves are intact.     Sensory: Sensation is intact.     Motor: Motor function is intact.     Coordination: Coordination is intact.     Gait: Gait is intact.  Psychiatric:        Speech: Speech normal.        Behavior: Behavior normal. Behavior is cooperative.      Assessment and Plan:    Cheryl Weber was seen today for hypertension.  Diagnoses and all orders for this visit:  Episodic lightheadedness EKG tracing is personally reviewed.  EKG notes NSR.  No acute changes.  Symptoms concerning for TIA. Blood work reviewed, update CMP, CBC and A1c  today. Will order MRI, carotid u/s, echo, and zio patch. ASCVD 5.8% -- borderline risk, encouraged statin, she will think about this. If return of or worsening symptoms, advised that she proceed to ER. -     EKG 12-Lead -     Comprehensive metabolic panel -     Hemoglobin A1c -  MR Brain Wo Contrast; Future -     ECHOCARDIOGRAM COMPLETE; Future -     LONG TERM MONITOR (3-14 DAYS); Future -     US Carotid Duplex Bilateral; Future  Primary hypertension Well controlled in office today. I've recommended that she continue to monitor her blood pressure and document for [place]. If numbers are consistently >130/80, she was advised to follow-up with me. For now, continue norvasc 5 mg daily. Follow-up in 4 months , sooner if concerns.  CMA or LPN served as scribe during this visit. History, Physical, and Plan performed by medical provider. The above documentation has been reviewed and is accurate and complete.  Time spent with patient today was 30 minutes which consisted of chart review, discussing diagnosis, work up, treatment answering questions and documentation.   Jarold Motto, PA-C [place], [place] 04/02/2021  Follow-up: No follow-ups on file.

## 2021-04-03 ENCOUNTER — Other Ambulatory Visit: Payer: Self-pay | Admitting: Physician Assistant

## 2021-04-03 ENCOUNTER — Telehealth: Payer: Self-pay

## 2021-04-03 ENCOUNTER — Other Ambulatory Visit (INDEPENDENT_AMBULATORY_CARE_PROVIDER_SITE_OTHER): Payer: 59

## 2021-04-03 ENCOUNTER — Telehealth: Payer: Self-pay | Admitting: *Deleted

## 2021-04-03 ENCOUNTER — Ambulatory Visit (HOSPITAL_COMMUNITY): Payer: 59

## 2021-04-03 DIAGNOSIS — R42 Dizziness and giddiness: Secondary | ICD-10-CM | POA: Diagnosis not present

## 2021-04-03 DIAGNOSIS — D649 Anemia, unspecified: Secondary | ICD-10-CM

## 2021-04-03 LAB — CBC
HCT: 35.5 % — ABNORMAL LOW (ref 36.0–46.0)
Hemoglobin: 11.7 g/dL — ABNORMAL LOW (ref 12.0–15.0)
MCHC: 32.9 g/dL (ref 30.0–36.0)
MCV: 78.9 fl (ref 78.0–100.0)
Platelets: 306 10*3/uL (ref 150.0–400.0)
RBC: 4.49 Mil/uL (ref 3.87–5.11)
RDW: 15.1 % (ref 11.5–15.5)
WBC: 5 10*3/uL (ref 4.0–10.5)

## 2021-04-03 NOTE — Telephone Encounter (Signed)
error 

## 2021-04-03 NOTE — Telephone Encounter (Signed)
Please call patient tomorrow to check in with her.  Please let her know that I'm concerned that she is having confusion and dizziness. We cannot just assume that this is related to a magnesium supplement which she has been taking. Please advise her to stop taking this if she hasn't already.  Due to her personal history of high blood pressure, high cholesterol, and family history of stroke, I want her to understand that she needs to be very aware of any stroke-like symptoms such as dizziness, confusion, loss of balance, weakness on one side of the body, can all be signs/symptoms of stroke and I do not want her to delay care.

## 2021-04-03 NOTE — Telephone Encounter (Signed)
Left message to patient to go to [facility] emergency room for MRI Stat To rule out TIA. Called x2

## 2021-04-03 NOTE — Telephone Encounter (Signed)
Patient return call Stated still with some symptoms of dizziness and confusion. Had same symptoms in the past but no Hx of stroke. If symptoms worsen will go to ED

## 2021-04-04 ENCOUNTER — Telehealth: Payer: Self-pay

## 2021-04-04 ENCOUNTER — Ambulatory Visit (HOSPITAL_COMMUNITY)
Admission: RE | Admit: 2021-04-04 | Discharge: 2021-04-04 | Disposition: A | Discharge status: Home / Self Care | Payer: 59 | Source: Ambulatory Visit | Attending: Physician Assistant | Admitting: Physician Assistant

## 2021-04-04 DIAGNOSIS — R42 Dizziness and giddiness: Secondary | ICD-10-CM | POA: Diagnosis present

## 2021-04-04 NOTE — Telephone Encounter (Signed)
Spoke with patient, stated she is feeling a bit calm today  Going to do the MRI Today at 6

## 2021-04-04 NOTE — Telephone Encounter (Signed)
Spoke with an interpreter and gave the patient all of the information that she will need for this appointment-patient did not answer the phone a VM was left with all of the information she will need for [facility]- Apt date-04/04/21 Time- 7 pm arrive at 6:30

## 2021-04-09 ENCOUNTER — Other Ambulatory Visit: Payer: Self-pay

## 2021-04-09 ENCOUNTER — Encounter (HOSPITAL_COMMUNITY): Payer: Self-pay

## 2021-04-09 ENCOUNTER — Ambulatory Visit (HOSPITAL_COMMUNITY): Payer: 59 | Attending: Cardiology

## 2021-04-09 DIAGNOSIS — R42 Dizziness and giddiness: Secondary | ICD-10-CM | POA: Insufficient documentation

## 2021-04-09 LAB — ECHOCARDIOGRAM COMPLETE
Area-P 1/2: 3.33 cm2
S' Lateral: 3 cm

## 2021-04-22 ENCOUNTER — Other Ambulatory Visit: Payer: Self-pay | Admitting: Physician Assistant

## 2021-05-03 ENCOUNTER — Other Ambulatory Visit: Payer: Self-pay

## 2021-05-03 MED ORDER — OMEPRAZOLE 20 MG PO CPDR: 20.0000 mg | DELAYED_RELEASE_CAPSULE | Freq: Every day | ORAL | 1 refills | Status: DC

## 2021-05-03 MED ORDER — FLUTICASONE PROPIONATE 50 MCG/ACT NA SUSP: 1.0000 | Freq: Every day | NASAL | 2 refills | Status: DC

## 2021-06-21 ENCOUNTER — Other Ambulatory Visit: Payer: Self-pay

## 2021-06-21 ENCOUNTER — Telehealth: Payer: Self-pay | Admitting: Family Medicine

## 2021-07-02 ENCOUNTER — Telehealth (INDEPENDENT_AMBULATORY_CARE_PROVIDER_SITE_OTHER): Payer: 59 | Admitting: Family Medicine

## 2021-07-02 ENCOUNTER — Encounter: Payer: Self-pay | Admitting: Family Medicine

## 2021-07-02 DIAGNOSIS — U071 COVID-19: Secondary | ICD-10-CM | POA: Diagnosis not present

## 2021-07-02 MED ORDER — ALBUTEROL SULFATE HFA 108 (90 BASE) MCG/ACT IN AERS: 2.0000 | INHALATION_SPRAY | Freq: Four times a day (QID) | RESPIRATORY_TRACT | 0 refills | Status: DC | PRN

## 2021-07-02 NOTE — Patient Instructions (Addendum)
   ---------------------------------------------------------------------------------------------------------------------------      WORK SLIP:  Patient Cheryl Weber,  [id number], was seen for a medical visit today, 07/02/21 . Please excuse from work today for a COVID like illness. We advise 10 days minimum from the onset of symptoms (06/20/21) PLUS 1 day of no fever and improved symptoms.   Sincerely: E-signature: Dr. Kriste Basque, DO [facility] [place] Ph: [phone]   ------------------------------------------------------------------------------------------------------------------------------  Stay hydrated  Eat a healthy diet and avoid dairy until all better  Use your albuterol every 4-6 hours IF NEEDED  -I sent the medication(s) we discussed to your pharmacy: Meds ordered this encounter  Medications   albuterol (VENTOLIN HFA) 108 (90 Base) MCG/ACT inhaler    Sig: Inhale 2 puffs into the lungs every 6 (six) hours as needed for wheezing or shortness of breath.    Dispense:  1 each    Refill:  0   Consider getting your 2nd booster once you are feeling all better.  Glad you are feeling better!  Seek in person care promptly if your symptoms worsen, new concerns arise or you are not improving with treatment.  It was nice to meet you today. I help [facility] out with telemedicine visits on Tuesdays and Thursdays and am available for visits on those days. If you have any concerns or questions following this visit please schedule a follow up visit with your Primary Care doctor or seek care at a local urgent care clinic to avoid delays in care.

## 2021-07-02 NOTE — Progress Notes (Signed)
Virtual Visit via Video Note  I connected with Cheryl Weber  on 07/02/21 at 10:40 AM EDT by a video enabled telemedicine application and verified that I am speaking with the correct person using two identifiers.  Location patient: home, [place] Location provider:work or home office Persons participating in the virtual visit: patient, provider -she declined an interpreter when I offered it for this visit  I discussed the limitations of evaluation and management by telemedicine and the availability of in person appointments. The patient expressed understanding and agreed to proceed.   HPI:  Acute telemedicine visit for Covid19: -Onset: 06/18/21; she had a positive covid test on July 21st and again on July 29th - she was surprised she had symptoms and tested positive for this long -Symptoms include:initially had nasal congestion, sore throat, fatigue, poor sleep, she had emesis 2x initially and had some SOB with her asthma -she had a prolonged cough and some asthma symptoms lingered until the last few days -requesting refill of her albuterol -currently feels "much better" but still has not taste and no smell an mild sinus congestion -Denies:CP, SOB now, NVD currently, inability to eat/drink/get out of bed -Has tried: -Pertinent past medical history:see below -Pertinent medication allergies: Allergies  Allergen Reactions   Gluten Meal Other (See Comments)   Lactase Diarrhea   Other Other (See Comments)   Pork Allergy Diarrhea   -COVID-19 vaccine status: had 2 doses and one booster  ROS: See pertinent positives and negatives per HPI.  Past Medical History:  Diagnosis Date   Allergy    Anxiety    Asthma    GERD (gastroesophageal reflux disease)    Hypertension    Vaginal delivery 1968, 1981    Past Surgical History:  Procedure Laterality Date   APPENDECTOMY     CESAREAN SECTION  1985   HYSTERECTOMY ABDOMINAL WITH SALPINGECTOMY     TONSILLECTOMY       Current Outpatient  Medications:    albuterol (VENTOLIN HFA) 108 (90 Base) MCG/ACT inhaler, Inhale 2 puffs into the lungs every 6 (six) hours as needed for wheezing or shortness of breath., Disp: 1 each, Rfl: 0   amLODipine (NORVASC) 5 MG tablet, TAKE 1 TABLET (5 MG TOTAL) BY MOUTH DAILY., Disp: 30 tablet, Rfl: 2   Ascorbic Acid (VITAMIN C PO), Take by mouth daily., Disp: , Rfl:    Cholecalciferol (VITAMIN D3) 50 MCG (2000 UT) CAPS, Take 1 capsule by mouth daily in the afternoon., Disp: , Rfl:    Doxylamine Succinate, Sleep, (SLEEP AID PO), Take 1 tablet by mouth at bedtime as needed., Disp: , Rfl:    MAGNESIUM PO, Take 50 mg by mouth daily in the afternoon., Disp: , Rfl:    MELATONIN PO, Take 1 capsule by mouth at bedtime as needed., Disp: , Rfl:    Nutritional Supplements (DHEA PO), Take by mouth daily., Disp: , Rfl:    omeprazole (PRILOSEC) 20 MG capsule, Take 1 capsule (20 mg total) by mouth daily., Disp: 90 capsule, Rfl: 1  EXAM:  VITALS per patient if applicable: Denies fever BP 138/85 HR 75  GENERAL: alert, oriented, appears well and in no acute distress  HEENT: atraumatic, conjunttiva clear, no obvious abnormalities on inspection of external nose and ears  NECK: normal movements of the head and neck  LUNGS: on inspection no signs of respiratory distress, breathing rate appears normal, no obvious gross SOB, gasping or wheezing  CV: no obvious cyanosis  MS: moves all visible extremities without noticeable abnormality  PSYCH/NEURO:  pleasant and cooperative, no obvious depression or anxiety, speech and thought processing grossly intact  ASSESSMENT AND PLAN:  Discussed the following assessment and plan:  COVID-19  -we discussed possible serious and likely etiologies, options for evaluation and workup, limitations of telemedicine visit vs in person visit, treatment, treatment risks and precautions. Pt prefers to treat via telemedicine empirically rather than in person at this moment. IT seems she  had a mildly prolonged course with covid but is now feeling "much better." I have seen quite a few cases in the last 2 weeks with patients remaining positive and feeling poorly for 10-14 days. I am glad she if feeling better now. Refilled her albuterol. Also discussed sub acute and long covid symptoms. Also, discussed getting her 2nd booster since has been 10 months since her last booster per her report and she plans to travel later this month.  Work/School slipped offered: provided in patient instructions   Advised to seek prompt in person care if worsening, new symptoms arise, or if is not improving with treatment. Discussed options for inperson care if PCP office not available. Did let this patient know that I only do telemedicine on Tuesdays and Thursdays for [place]. Advised to schedule follow up visit with PCP or [facility] if any further questions or concerns to avoid delays in care.   I discussed the assessment and treatment plan with the patient. The patient was provided an opportunity to ask questions and all were answered. The patient agreed with the plan and demonstrated an understanding of the instructions.     Terressa Koyanagi, DO

## 2021-07-06 ENCOUNTER — Encounter: Payer: Self-pay | Admitting: Physician Assistant

## 2021-07-06 ENCOUNTER — Other Ambulatory Visit: Payer: Self-pay | Admitting: Family Medicine

## 2021-08-02 ENCOUNTER — Other Ambulatory Visit: Payer: Self-pay | Admitting: Family Medicine

## 2021-08-15 ENCOUNTER — Encounter: Payer: Self-pay | Admitting: Physician Assistant

## 2021-08-16 MED ORDER — SERTRALINE HCL 25 MG PO TABS: 25.0000 mg | ORAL_TABLET | Freq: Every day | ORAL | 0 refills | Status: DC

## 2021-08-16 NOTE — Telephone Encounter (Signed)
Pt is requesting Sertraline Rx. Please advise if okay to fill and dosage?

## 2021-09-21 ENCOUNTER — Other Ambulatory Visit: Payer: Self-pay | Admitting: Physician Assistant

## 2021-11-05 NOTE — Progress Notes (Incomplete)
Cheryl Weber is a 65 y.o. female here for a pap smear.  SCRIBE STATEMENT  History of Present Illness:   No chief complaint on file.   HPI  Pap Smear *** Past Medical History:  Diagnosis Date   Allergy    Anxiety    Asthma    GERD (gastroesophageal reflux disease)    Hypertension    Vaginal delivery 1968, 1981     Social History   Tobacco Use   Smoking status: Former   Smokeless tobacco: Never   Tobacco comments:    quit 1987  Vaping Use   Vaping Use: Never used  Substance Use Topics   Alcohol use: Yes    Alcohol/week: 4.0 - 5.0 standard drinks    Types: 4 - 5 Glasses of wine per week   Drug use: Never    Past Surgical History:  Procedure Laterality Date   APPENDECTOMY     CESAREAN SECTION  1985   HYSTERECTOMY ABDOMINAL WITH SALPINGECTOMY     TONSILLECTOMY      Family History  Problem Relation Age of Onset   Lung cancer Father    Cirrhosis Father    Alcohol abuse Father     Allergies  Allergen Reactions   Gluten Meal Other (See Comments)   Other Other (See Comments)   Pork Allergy Diarrhea   Tilactase Diarrhea    Current Medications:   Current Outpatient Medications:    albuterol (VENTOLIN HFA) 108 (90 Base) MCG/ACT inhaler, Inhale 2 puffs into the lungs every 6 (six) hours as needed for wheezing or shortness of breath., Disp: 1 each, Rfl: 0   amLODipine (NORVASC) 5 MG tablet, TAKE 1 TABLET (5 MG TOTAL) BY MOUTH DAILY., Disp: 90 tablet, Rfl: 1   Ascorbic Acid (VITAMIN C PO), Take by mouth daily., Disp: , Rfl:    Cholecalciferol (VITAMIN D3) 50 MCG (2000 UT) CAPS, Take 1 capsule by mouth daily in the afternoon., Disp: , Rfl:    Doxylamine Succinate, Sleep, (SLEEP AID PO), Take 1 tablet by mouth at bedtime as needed., Disp: , Rfl:    MAGNESIUM PO, Take 50 mg by mouth daily in the afternoon., Disp: , Rfl:    MELATONIN PO, Take 1 capsule by mouth at bedtime as needed., Disp: , Rfl:    Nutritional Supplements (DHEA PO), Take by mouth  daily., Disp: , Rfl:    omeprazole (PRILOSEC) 20 MG capsule, Take 1 capsule (20 mg total) by mouth daily., Disp: 90 capsule, Rfl: 1   sertraline (ZOLOFT) 25 MG tablet, Take 1 tablet (25 mg total) by mouth daily., Disp: 90 tablet, Rfl: 0   Review of Systems:   ROS Negative unless otherwise specified per HPI. Vitals:   There were no vitals filed for this visit.   There is no height or weight on file to calculate BMI.  Physical Exam:   Physical Exam  Assessment and Plan:  Pap Smear for Cervical Cancer Screening ***    I,Havlyn C Ratchford,acting as a scribe for Jarold Motto, PA.,have documented all relevant documentation on the behalf of Jarold Motto, PA,as directed by  Jarold Motto, PA while in the presence of Jarold Motto, [place].  ***  Jarold Motto, PA-C

## 2021-11-06 ENCOUNTER — Other Ambulatory Visit: Payer: Self-pay

## 2021-11-06 ENCOUNTER — Encounter: Payer: Self-pay | Admitting: Physician Assistant

## 2021-11-06 ENCOUNTER — Other Ambulatory Visit: Payer: Self-pay | Admitting: Physician Assistant

## 2021-11-06 ENCOUNTER — Other Ambulatory Visit (HOSPITAL_COMMUNITY)
Admission: RE | Admit: 2021-11-06 | Discharge: 2021-11-06 | Disposition: A | Discharge status: Home / Self Care | Payer: 59 | Source: Ambulatory Visit | Attending: Physician Assistant | Admitting: Physician Assistant

## 2021-11-06 ENCOUNTER — Ambulatory Visit (INDEPENDENT_AMBULATORY_CARE_PROVIDER_SITE_OTHER): Payer: 59 | Admitting: Physician Assistant

## 2021-11-06 VITALS — BP 130/70 | HR 75 | Temp 98.2°F | Ht 67.0 in | Wt 169.2 lb

## 2021-11-06 DIAGNOSIS — Z0000 Encounter for general adult medical examination without abnormal findings: Secondary | ICD-10-CM

## 2021-11-06 DIAGNOSIS — Z124 Encounter for screening for malignant neoplasm of cervix: Secondary | ICD-10-CM | POA: Insufficient documentation

## 2021-11-06 DIAGNOSIS — R710 Precipitous drop in hematocrit: Secondary | ICD-10-CM | POA: Diagnosis not present

## 2021-11-06 DIAGNOSIS — F419 Anxiety disorder, unspecified: Secondary | ICD-10-CM | POA: Diagnosis not present

## 2021-11-06 DIAGNOSIS — I10 Essential (primary) hypertension: Secondary | ICD-10-CM | POA: Diagnosis not present

## 2021-11-06 LAB — CBC WITH DIFFERENTIAL/PLATELET
Basophils Absolute: 0 10*3/uL (ref 0.0–0.1)
Basophils Relative: 0.4 % (ref 0.0–3.0)
Eosinophils Absolute: 0.1 10*3/uL (ref 0.0–0.7)
Eosinophils Relative: 1.1 % (ref 0.0–5.0)
HCT: 37.9 % (ref 36.0–46.0)
Hemoglobin: 12.2 g/dL (ref 12.0–15.0)
Lymphocytes Relative: 38.8 % (ref 12.0–46.0)
Lymphs Abs: 2.1 10*3/uL (ref 0.7–4.0)
MCHC: 32.2 g/dL (ref 30.0–36.0)
MCV: 79.5 fl (ref 78.0–100.0)
Monocytes Absolute: 0.7 10*3/uL (ref 0.1–1.0)
Monocytes Relative: 12.8 % — ABNORMAL HIGH (ref 3.0–12.0)
Neutro Abs: 2.5 10*3/uL (ref 1.4–7.7)
Neutrophils Relative %: 46.9 % (ref 43.0–77.0)
Platelets: 290 10*3/uL (ref 150.0–400.0)
RBC: 4.76 Mil/uL (ref 3.87–5.11)
RDW: 14.9 % (ref 11.5–15.5)
WBC: 5.3 10*3/uL (ref 4.0–10.5)

## 2021-11-06 LAB — IBC + FERRITIN
Ferritin: 25.4 ng/mL (ref 10.0–291.0)
Iron: 93 ug/dL (ref 42–145)
Saturation Ratios: 24.8 % (ref 20.0–50.0)
TIBC: 375.2 ug/dL (ref 250.0–450.0)
Transferrin: 268 mg/dL (ref 212.0–360.0)

## 2021-11-06 NOTE — Progress Notes (Signed)
Subjective:    Cheryl Weber is a 65 y.o. female and is here for a comprehensive physical exam.  An interpreter, Misty Stanley, was present during this visit.  HPI  Health Maintenance Due  Topic Date Due   HIV Screening  Never done   Hepatitis C Screening  Never done   TETANUS/TDAP  Never done   PAP SMEAR-Modifier  Never done   Zoster Vaccines- Shingrix (2 of 2) 03/31/2021   Acute Concerns: None  Chronic Issues: Anxiety Alazae states she has been compliant with zoloft 25 mg daily with no adverse effects. She is managing well. Denies SI/HI.   HTN Currently compliant with taking amlodipine 5 mg daily. At home blood pressure readings are: not checked regularly. Patient denies chest pain, SOB, blurred vision, dizziness, unusual headaches, lower leg swelling. Denies excessive caffeine intake, stimulant usage, excessive alcohol intake, or increase in salt consumption.  BP Readings from Last 3 Encounters:  11/06/21 130/70  04/02/21 130/80  02/26/21 (!) 142/86   Decreased hgb Hx of anemia. On labs checked in May she was found to have decreased hemoglobin by 0.2 g/dL, she was instructed to come get stool cards but this was not done to follow-up on this.  She denies any overt rectal bleeding, lightheadedness or dizziness. she is up-to-date on her colonoscopy. She was seeing [facility] in 2021.  Health Maintenance: Immunizations -- Covid- UTD Influenza- UTD Tdap- Not completed  Colonoscopy -- UTD;2019 Mammogram -- Will be scheduled within next week PAP --11/06/21 Bone Density -- N/A Diet -- Eats all food groups Sleep habits -- Normal schedule Ophthalmology- Not UTD Dentistry- Not UTD Exercise -- Not currently Weight -- Stable Mood -- Stable Weight history: Wt Readings from Last 10 Encounters:  11/06/21 169 lb 4 oz (76.8 kg)  04/02/21 167 lb 6.1 oz (75.9 kg)  02/26/21 172 lb 3.2 oz (78.1 kg)  01/28/21 171 lb 6.1 oz (77.7 kg)   Body mass index is 26.51 kg/m. No  LMP recorded. Patient has had a hysterectomy. Alcohol use:  reports current alcohol use of about 4.0 - 5.0 standard drinks per week. Tobacco use:  Tobacco Use: Medium Risk   Smoking Tobacco Use: Former   Smokeless Tobacco Use: Never   Passive Exposure: Not on file     Depression screen [facility] 2/9 01/28/2021  Decreased Interest 0  Down, Depressed, Hopeless 0  PHQ - 2 Score 0     Other providers/specialists: Patient Care Team: Jarold Motto, [place] as PCP - General (Physician Assistant)    PMHx, SurgHx, SocialHx, Medications, and Allergies were reviewed in the Visit Navigator and updated as appropriate.   Past Medical History:  Diagnosis Date   Allergy    Anxiety    Asthma    GERD (gastroesophageal reflux disease)    Hypertension    Vaginal delivery 1968, 1981     Past Surgical History:  Procedure Laterality Date   APPENDECTOMY     CESAREAN SECTION  1985   HYSTERECTOMY ABDOMINAL WITH SALPINGECTOMY     TONSILLECTOMY       Family History  Problem Relation Age of Onset   Lung cancer Father    Cirrhosis Father    Alcohol abuse Father     Social History   Tobacco Use   Smoking status: Former   Smokeless tobacco: Never   Tobacco comments:    quit 1987  Vaping Use   Vaping Use: Never used  Substance Use Topics   Alcohol use: Yes  Alcohol/week: 4.0 - 5.0 standard drinks    Types: 4 - 5 Glasses of wine per week   Drug use: Never    Review of Systems:   Review of Systems  Constitutional:  Negative for chills, fever, malaise/fatigue and weight loss.  HENT:  Negative for hearing loss, sinus pain and sore throat.   Respiratory:  Negative for cough and hemoptysis.   Cardiovascular:  Negative for chest pain, palpitations, leg swelling and PND.  Gastrointestinal:  Negative for abdominal pain, constipation, diarrhea, heartburn, nausea and vomiting.  Genitourinary:  Negative for dysuria, frequency and urgency.  Musculoskeletal:  Negative for back pain, myalgias  and neck pain.  Skin:  Negative for itching and rash.  Neurological:  Negative for dizziness, tingling, seizures and headaches.  Endo/Heme/Allergies:  Negative for polydipsia.  Psychiatric/Behavioral:  Negative for depression. The patient is not nervous/anxious.    Objective:   BP 130/70   Pulse 75   Temp 98.2 F (36.8 C) (Temporal)   Ht 5\' 7"  (1.702 m)   Wt 169 lb 4 oz (76.8 kg)   SpO2 99%   BMI 26.51 kg/m  Body mass index is 26.51 kg/m.   General Appearance:    Alert, cooperative, no distress, appears stated age  Head:    Normocephalic, without obvious abnormality, atraumatic  Eyes:    PERRL, conjunctiva/corneas clear, EOM's intact, fundi    benign, both eyes  Ears:    Normal TM's and external ear canals, both ears  Nose:   Nares normal, septum midline, mucosa normal, no drainage    or sinus tenderness  Throat:   Lips, mucosa, and tongue normal; teeth and gums normal  Neck:   Supple, symmetrical, trachea midline, no adenopathy;    thyroid:  no enlargement/tenderness/nodules; no carotid   bruit or JVD  Back:     Symmetric, no curvature, ROM normal, no CVA tenderness  Lungs:     Clear to auscultation bilaterally, respirations unlabored  Chest Wall:    No tenderness or deformity   Heart:    Regular rate and rhythm, S1 and S2 normal, no murmur, rub or gallop  Breast Exam:    Deferred  Abdomen:     Soft, non-tender, bowel sounds active all four quadrants,    no masses, no organomegaly  Genitalia:    Normal female without lesion, discharge or tenderness  Extremities:   Extremities normal, atraumatic, no cyanosis or edema  Pulses:   2+ and symmetric all extremities  Skin:   Skin color, texture, turgor normal, no rashes or lesions  Lymph nodes:   Cervical, supraclavicular, and axillary nodes normal  Neurologic:   CNII-XII intact, normal strength, sensation and reflexes    throughout    Assessment/Plan:   Routine Physical Examination Today patient counseled on age  appropriate routine health concerns for screening and prevention, each reviewed and up to date or declined. Immunizations reviewed and up to date or declined. Labs ordered and reviewed. Risk factors for depression reviewed and negative. Hearing function and visual acuity are intact. ADLs screened and addressed as needed. Functional ability and level of safety reviewed and appropriate. Education, counseling and referrals performed based on assessed risks today. Patient provided with a copy of personalized plan for preventive services.  Anxiety Stable Continue zoloft 25 mg daily Denied SI/HI today Advised that if they develop any SI, to tell someone immediately and seek medical attention Follow-up in 6 months, sooner if concerns  Primary hypertension Controlled Maintain amlodipine 5 mg daily  Monitor  BP regularly at home  I advised patient that if BP read consistently >150/100 to reach out to office and medications will be adjusted accordingly  Decreased Hgb Asymptomatic We will update blood work today and advise further based on results  Pap Smear for Cervical Cancer Screening Completed today    Patient Counseling: [x]    Nutrition: Stressed importance of moderation in sodium/caffeine intake, saturated fat and cholesterol, caloric balance, sufficient intake of fresh fruits, vegetables, fiber, calcium, iron, and 1 mg of folate supplement per day (for females capable of pregnancy).  [x]    Stressed the importance of regular exercise.   [x]    Substance Abuse: Discussed cessation/primary prevention of tobacco, alcohol, or other drug use; driving or other dangerous activities under the influence; availability of treatment for abuse.   [x]    Injury prevention: Discussed safety belts, safety helmets, smoke detector, smoking near bedding or upholstery.   [x]    Sexuality: Discussed sexually transmitted diseases, partner selection, use of condoms, avoidance of unintended pregnancy  and contraceptive  alternatives.  [x]    Dental health: Discussed importance of regular tooth brushing, flossing, and dental visits.  [x]    Health maintenance and immunizations reviewed. Please refer to Health maintenance section.   I,Havlyn C Ratchford,acting as a for , PA.,have documented all relevant documentation on the behalf of , PA,as directed by  , PA while in the presence of , .  I, , Neurosurgeon, have reviewed all documentation for this visit. The documentation on 11/06/21 for the exam, diagnosis, procedures, and orders are all accurate and complete.   Jarold Motto, PA-C [facility]

## 2021-11-06 NOTE — Patient Instructions (Signed)
It was great to see you! ? ?Please go to the lab for blood work.  ? ?Our office will call you with your results unless you have chosen to receive results via MyChart. ? ?If your blood work is normal we will follow-up each year for physicals and as scheduled for chronic medical problems. ? ?If anything is abnormal we will treat accordingly and get you in for a follow-up. ? ?Take care, ? ?Shineka Auble ?  ? ? ?

## 2021-11-07 LAB — CYTOLOGY - PAP
Comment: NEGATIVE
Diagnosis: NEGATIVE
High risk HPV: NEGATIVE

## 2022-01-08 ENCOUNTER — Other Ambulatory Visit: Payer: Self-pay | Admitting: Physician Assistant

## 2022-02-24 ENCOUNTER — Other Ambulatory Visit: Payer: Self-pay | Admitting: Family Medicine

## 2022-02-24 ENCOUNTER — Other Ambulatory Visit: Payer: Self-pay | Admitting: Physician Assistant

## 2022-02-24 MED ORDER — OMEPRAZOLE 20 MG PO CPDR: 20.0000 mg | DELAYED_RELEASE_CAPSULE | Freq: Every day | ORAL | 1 refills | Status: DC

## 2022-02-24 MED ORDER — SERTRALINE HCL 25 MG PO TABS: 25.0000 mg | ORAL_TABLET | Freq: Every day | ORAL | 0 refills | Status: DC

## 2022-05-24 ENCOUNTER — Other Ambulatory Visit: Payer: Self-pay | Admitting: Physician Assistant

## 2022-05-26 ENCOUNTER — Other Ambulatory Visit: Payer: Self-pay | Admitting: Physician Assistant

## 2022-06-20 IMAGING — MR MR HEAD W/O CM
12 series · 48 of 48 positions shown · non-contrast
Comparison: None.

CLINICAL DATA: TIA, episodic lightheadedness

EXAM:
MRI HEAD WITHOUT CONTRAST
TECHNIQUE: Multiplanar, multiecho pulse sequences of the brain and surrounding
structures were obtained without intravenous contrast.

[Series 5: DWI · axial · 3.0mm · 1.36mm/px · z∈[-42,+110]mm · 6 of 104 slices shown (1 of 2)]
[im 1/104]
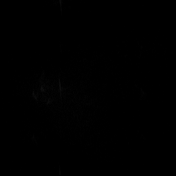
[im 21/104]
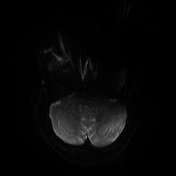
[im 42/104]
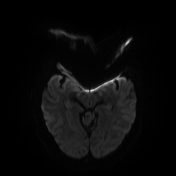
[im 62/104]
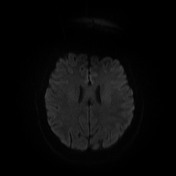
[im 83/104]
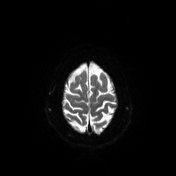
[im 104/104]
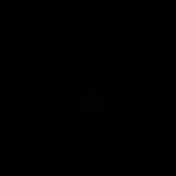

[Series 6: DWI · axial · 3.0mm · 1.36mm/px · z∈[-39,+107]mm · 4 of 50 slices shown (2 of 2)]
[im 1/50]
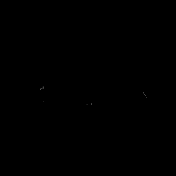
[im 17/50]
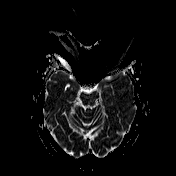
[im 33/50]
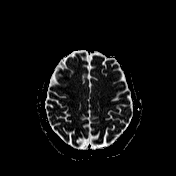
[im 50/50]
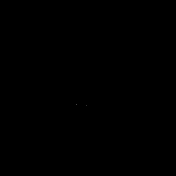

[Series 7: T1 · sagittal · 5.0mm · 0.75mm/px · 2 of 24 slices shown (1 of 2)]
[im 1/24]
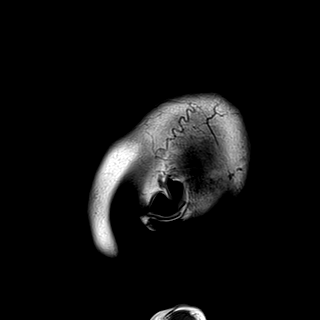
[im 24/24]
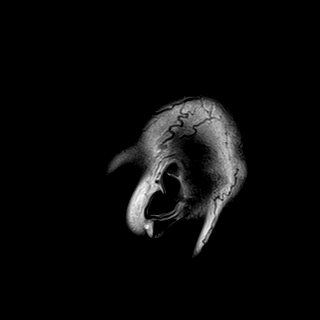

[Series 8: T2 · axial · 5.0mm · 0.62mm/px · z∈[-55,+107]mm · 2 of 26 slices shown (1 of 2)]
[im 1/26]
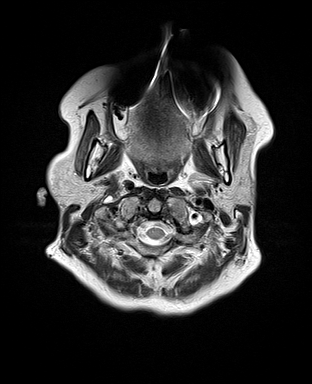
[im 26/26]
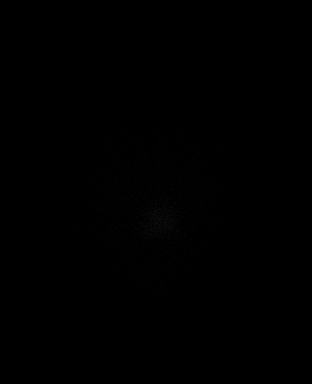

[Series 10: swi_images · axial · 3.0mm · 0.75mm/px · z∈[-56,+108]mm · 4 of 56 slices shown]
[im 1/56]
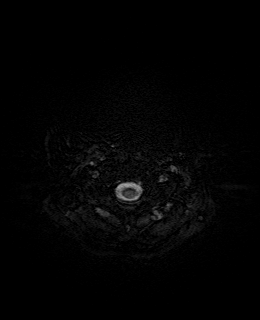
[im 19/56]
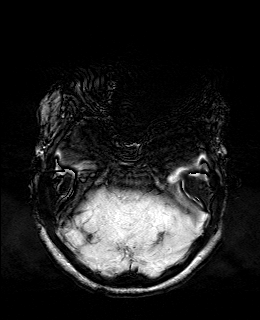
[im 37/56]
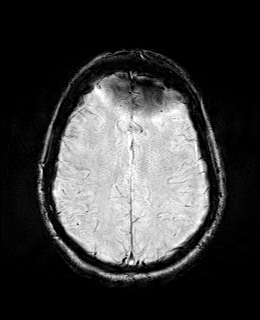
[im 56/56]
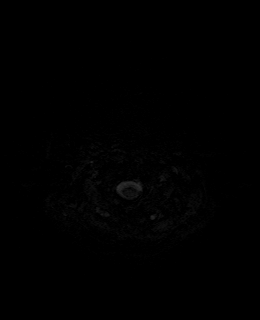

[Series 11: FLAIR · axial · 3.0mm · 0.75mm/px · z∈[-50,+102]mm · 4 of 52 slices shown]
[im 1/52]
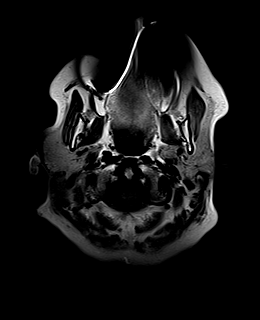
[im 18/52]
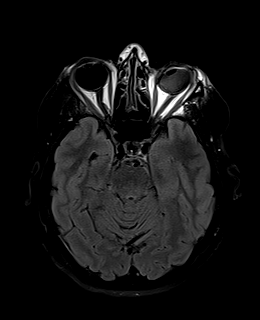
[im 35/52]
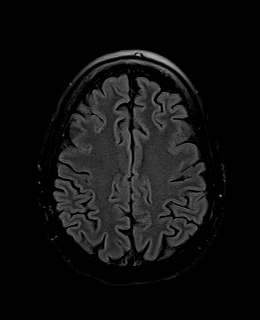
[im 52/52]
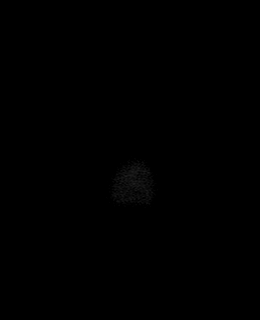

[Series 12: T1 · axial · 1.0mm · 0.94mm/px · z∈[-53,+105]mm · 11 of 160 slices shown (2 of 2)]
[im 1/160]
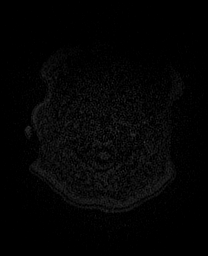
[im 16/160]
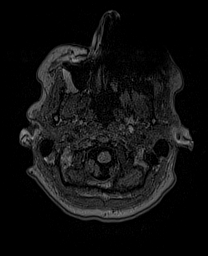
[im 32/160]
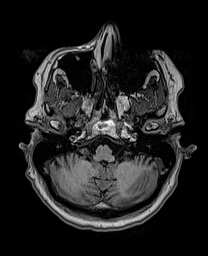
[im 48/160]
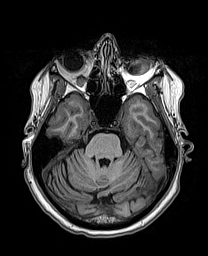
[im 64/160]
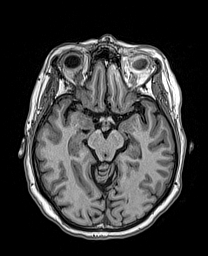
[im 80/160]
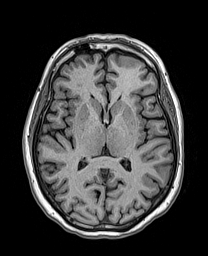
[im 96/160]
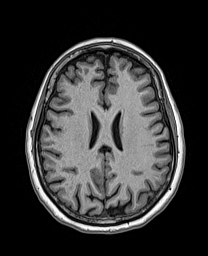
[im 112/160]
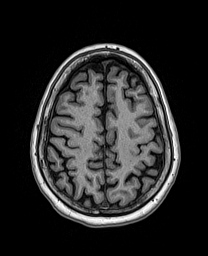
[im 128/160]
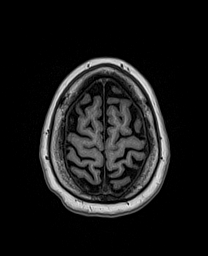
[im 144/160]
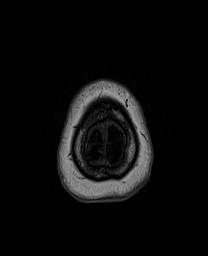
[im 160/160]
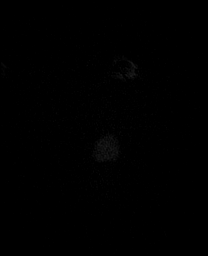

[Series 13: cor dwi_tracew · coronal · 5.0mm · 1.53mm/px · 4 of 60 slices shown]
[im 1/60]
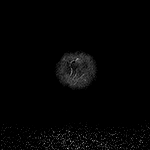
[im 20/60]
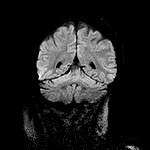
[im 40/60]
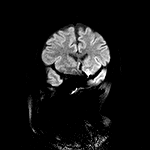
[im 60/60]
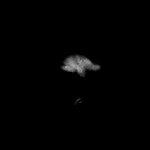

[Series 14: cor dwi_adc · coronal · 5.0mm · 1.53mm/px · 2 of 29 slices shown]
[im 1/29]
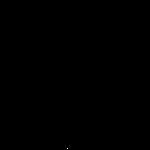
[im 29/29]
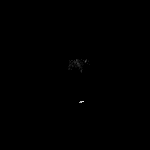

[Series 15: resolve dwi_tracew · axial · 5.0mm · 1.67mm/px · z∈[-51,+105]mm · 4 of 50 slices shown]
[im 1/50]
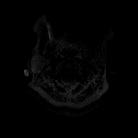
[im 17/50]
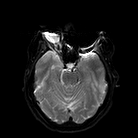
[im 33/50]
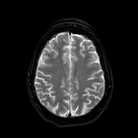
[im 50/50]
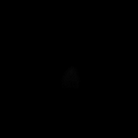

[Series 16: resolve dwi_adc · axial · 5.0mm · 1.67mm/px · z∈[-51,+92]mm · 2 of 23 slices shown]
[im 1/23]
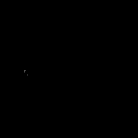
[im 23/23]
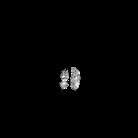

[Series 17: T2 · coronal · 5.0mm · 0.57mm/px · 3 of 37 slices shown (2 of 2)]
[im 1/37]
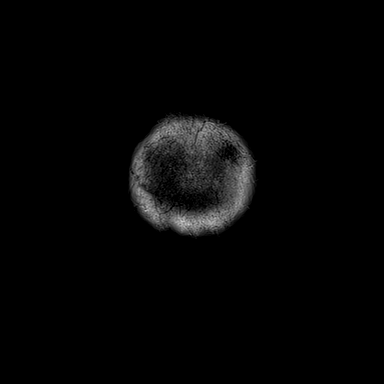
[im 19/37]
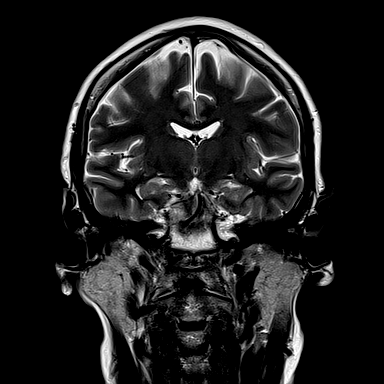
[im 37/37]
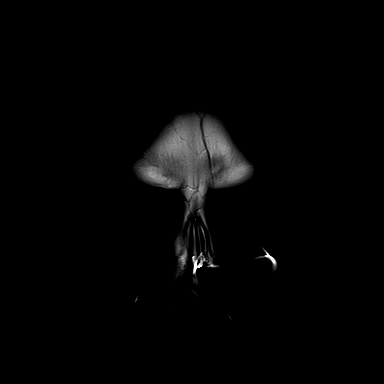

[48 of 48 positions shown; findings below may reference images not displayed]

FINDINGS: There is significant susceptibility artifact from dental hardware.
This variably obscures or distorts adjacent structures depending on
the sequence. Findings below are within this limitation.

Brain: There is no acute infarction or intracranial hemorrhage.
There is no intracranial mass, mass effect, or edema. There is no
hydrocephalus or extra-axial fluid collection. Ventricles and sulci
are normal in size and configuration.

Vascular: Major vessel flow voids at the skull base are preserved.

Skull and upper cervical spine: Normal marrow signal is preserved.

Sinuses/Orbits: Paranasal sinuses are aerated. Orbits are
unremarkable.

Other: Sella is unremarkable.  Mastoid air cells are clear.
IMPRESSION: No evidence of recent infarction, hemorrhage, or mass.

## 2022-06-21 ENCOUNTER — Other Ambulatory Visit: Payer: Self-pay | Admitting: Physician Assistant

## 2022-07-31 ENCOUNTER — Encounter: Payer: Self-pay | Admitting: Physician Assistant

## 2022-08-25 ENCOUNTER — Encounter: Payer: Self-pay | Admitting: *Deleted

## 2022-09-05 ENCOUNTER — Ambulatory Visit (INDEPENDENT_AMBULATORY_CARE_PROVIDER_SITE_OTHER): Payer: Commercial Managed Care - HMO | Admitting: Physician Assistant

## 2022-09-05 ENCOUNTER — Encounter: Payer: Self-pay | Admitting: Physician Assistant

## 2022-09-05 ENCOUNTER — Telehealth: Payer: Self-pay | Admitting: Physician Assistant

## 2022-09-05 ENCOUNTER — Other Ambulatory Visit: Payer: Self-pay | Admitting: Physician Assistant

## 2022-09-05 VITALS — BP 118/80 | HR 71 | Temp 98.0°F | Ht 67.0 in | Wt 176.0 lb

## 2022-09-05 DIAGNOSIS — H9201 Otalgia, right ear: Secondary | ICD-10-CM

## 2022-09-05 DIAGNOSIS — L719 Rosacea, unspecified: Secondary | ICD-10-CM | POA: Diagnosis not present

## 2022-09-05 MED ORDER — IPRATROPIUM BROMIDE 0.06 % NA SOLN: 2.0000 | Freq: Four times a day (QID) | NASAL | 2 refills | Status: DC

## 2022-09-05 MED ORDER — IVERMECTIN 1 % EX CREA: TOPICAL_CREAM | CUTANEOUS | 1 refills | Status: DC

## 2022-09-05 MED ORDER — METRONIDAZOLE 1 % EX GEL: Freq: Every day | CUTANEOUS | 0 refills | Status: DC

## 2022-09-05 NOTE — Patient Instructions (Signed)
It was great to see you!  Instead of Flonase, lets trial Atrovent nasal spray -- I have sent this in  I have also sent in the cream for your rosacea  Take care,  Inda Coke PA-C

## 2022-09-05 NOTE — Telephone Encounter (Signed)
Please let patient know that ivermectin was not covered so an alternative was sent.

## 2022-09-05 NOTE — Progress Notes (Signed)
Cheryl Weber is a 66 y.o. female here for a new problem.  History of Present Illness:   Chief Complaint  Patient presents with   Rash    Pt has hx of Roseca, has outbreak on face now x 2 months. She would like medication. Pt has used in the past Cilantra from Dermatologist in [place]    Pt c/o right ear pain, feels like a needle is going through it, x 20 days, some dizziness. Denies drainage.   Patient with in-person interpretor -- Marta.    Rosacea -- has been taking Soolantra topically for her rosacea. From the DR. Needs refill. She bought some oral ivermectin and has been crushing this up and applying it to her skin. Symptoms are getting worse with time.  R ear pain -- For the past two weeks she has used flonase twice daily and zyrtec daily. She has sinus congestion. Symptoms have improved just slightly since starting this regimen. Denies fevers, chills, cough, sore throat. She does suffer from allergies.    Past Medical History:  Diagnosis Date   Allergy    Anxiety    Asthma    GERD (gastroesophageal reflux disease)    Hypertension    Vaginal delivery 1968, 1981     Social History   Tobacco Use   Smoking status: Former   Smokeless tobacco: Never   Tobacco comments:    quit 1987  Vaping Use   Vaping Use: Never used  Substance Use Topics   Alcohol use: Yes    Alcohol/week: 4.0 - 5.0 standard drinks of alcohol    Types: 4 - 5 Glasses of wine per week   Drug use: Never    Past Surgical History:  Procedure Laterality Date   APPENDECTOMY     CESAREAN SECTION  1985   HYSTERECTOMY ABDOMINAL WITH SALPINGECTOMY     TONSILLECTOMY      Family History  Problem Relation Age of Onset   Lung cancer Father    Cirrhosis Father    Alcohol abuse Father     Allergies  Allergen Reactions   Gluten Meal Other (See Comments)   Other Other (See Comments)   Pork Allergy Diarrhea   Tilactase Diarrhea    Current Medications:   Current  Outpatient Medications:    albuterol (VENTOLIN HFA) 108 (90 Base) MCG/ACT inhaler, Inhale 2 puffs into the lungs every 6 (six) hours as needed for wheezing or shortness of breath., Disp: 1 each, Rfl: 0   amLODipine (NORVASC) 5 MG tablet, TAKE 1 TABLET (5 MG TOTAL) BY MOUTH DAILY., Disp: 90 tablet, Rfl: 1   Ascorbic Acid (VITAMIN C PO), Take by mouth daily., Disp: , Rfl:    Cholecalciferol (VITAMIN D3) 50 MCG (2000 UT) CAPS, Take 1 capsule by mouth daily in the afternoon., Disp: , Rfl:    Doxylamine Succinate, Sleep, (SLEEP AID PO), Take 1 tablet by mouth at bedtime as needed., Disp: , Rfl:    MAGNESIUM PO, Take 50 mg by mouth daily in the afternoon., Disp: , Rfl:    MELATONIN PO, Take 1 capsule by mouth at bedtime as needed., Disp: , Rfl:    Nutritional Supplements (DHEA PO), Take by mouth daily., Disp: , Rfl:    sertraline (ZOLOFT) 25 MG tablet, TAKE 1 TABLET (25 MG TOTAL) BY MOUTH DAILY., Disp: 30 tablet, Rfl: 0   Review of Systems:   Review of Systems  HENT:  Positive for ear pain.   Skin:  Positive for rash.   Negative unless otherwise specified per HPI.   Vitals:   Vitals:   09/05/22 1040  BP: 118/80  Pulse: 71  Temp: 98 F (36.7 C)  TempSrc: Temporal  SpO2: 98%  Weight: 176 lb (79.8 kg)  Height: 5\' 7"  (1.702 m)     Body mass index is 27.57 kg/m.  Physical Exam:   Physical Exam Vitals and nursing note reviewed.  Constitutional:      General: She is not in acute distress.    Appearance: She is well-developed. She is not ill-appearing or toxic-appearing.  HENT:     Right Ear: A middle ear effusion is present. Tympanic membrane is not perforated or erythematous.     Left Ear: A middle ear effusion is present. Tympanic membrane is not perforated or erythematous.  Cardiovascular:     Rate and Rhythm: Normal rate and regular rhythm.     Pulses: Normal pulses.     Heart sounds: Normal heart sounds, S1 normal and S2 normal.  Pulmonary:     Effort: Pulmonary effort is  normal.     Breath sounds: Normal breath sounds.  Skin:    General: Skin is warm and dry.     Comments: Multiple erythematous papules to bilateral cheeks and chin.  Neurological:     Mental Status: She is alert.     GCS: GCS eye subscore is 4. GCS verbal subscore is 5. GCS motor subscore is 6.  Psychiatric:        Speech: Speech normal.        Behavior: Behavior normal. Behavior is cooperative.     Assessment and Plan:   Rosacea Uncontrolled I have refilled her ivermectin cream Follow-up if any worsening or lack of improvement  Right ear pain No evidence of infection We will follow changing her nasal spray from Flonase to Atrovent for suspected eustachian tube dysfunction Follow-up if any new or worsening symptoms    , PA-C

## 2022-09-05 NOTE — Telephone Encounter (Signed)
Please see message from pharmacy. Ivermectin 1% cream not covered by insurance.

## 2022-09-08 NOTE — Telephone Encounter (Signed)
Patient states Ivermectin is too expensive (over $200).  Patient requests to be called at ph# [phone] to advised re: discount coupons or alternative medication.

## 2022-09-08 NOTE — Telephone Encounter (Signed)
Spoke to pt told her Cheryl Weber sent another medication to the pharmacy on Friday cause we knew it was too expensive and insurance would not cover it. Told her Cheryl Weber sent in Metronidazole 1% gel. Pt verbalized understanding and said she has used that before and it does not help much. Told her she will have to try the Metrogel cause insurance will not pay for Ivermectin. Pt verbalized understanding.

## 2022-09-25 ENCOUNTER — Other Ambulatory Visit: Payer: Self-pay | Admitting: Physician Assistant

## 2022-11-25 ENCOUNTER — Other Ambulatory Visit: Payer: Self-pay | Admitting: Physician Assistant

## 2022-12-26 ENCOUNTER — Other Ambulatory Visit: Payer: Self-pay | Admitting: Physician Assistant

## 2023-01-01 ENCOUNTER — Other Ambulatory Visit: Payer: Self-pay | Admitting: Family Medicine

## 2023-01-04 ENCOUNTER — Other Ambulatory Visit: Payer: Self-pay | Admitting: Family Medicine

## 2023-01-05 MED ORDER — ALBUTEROL SULFATE HFA 108 (90 BASE) MCG/ACT IN AERS: 2.0000 | INHALATION_SPRAY | Freq: Four times a day (QID) | RESPIRATORY_TRACT | 0 refills | Status: DC | PRN

## 2023-01-14 ENCOUNTER — Other Ambulatory Visit: Payer: Self-pay | Admitting: Physician Assistant

## 2023-01-14 NOTE — Telephone Encounter (Signed)
Okay to refill? 

## 2023-01-28 ENCOUNTER — Other Ambulatory Visit: Payer: Self-pay | Admitting: Physician Assistant

## 2023-05-18 ENCOUNTER — Ambulatory Visit (INDEPENDENT_AMBULATORY_CARE_PROVIDER_SITE_OTHER): Payer: Commercial Managed Care - HMO | Admitting: Physician Assistant

## 2023-05-18 ENCOUNTER — Encounter: Payer: Self-pay | Admitting: Physician Assistant

## 2023-05-18 VITALS — BP 130/84 | HR 62 | Temp 97.5°F | Ht 67.0 in | Wt 179.2 lb

## 2023-05-18 DIAGNOSIS — Z0001 Encounter for general adult medical examination with abnormal findings: Secondary | ICD-10-CM | POA: Diagnosis not present

## 2023-05-18 DIAGNOSIS — I10 Essential (primary) hypertension: Secondary | ICD-10-CM | POA: Diagnosis not present

## 2023-05-18 DIAGNOSIS — E663 Overweight: Secondary | ICD-10-CM

## 2023-05-18 DIAGNOSIS — M25512 Pain in left shoulder: Secondary | ICD-10-CM | POA: Diagnosis not present

## 2023-05-18 DIAGNOSIS — F419 Anxiety disorder, unspecified: Secondary | ICD-10-CM

## 2023-05-18 DIAGNOSIS — R1012 Left upper quadrant pain: Secondary | ICD-10-CM

## 2023-05-18 LAB — CBC WITH DIFFERENTIAL/PLATELET
Basophils Absolute: 0 10*3/uL (ref 0.0–0.1)
Basophils Relative: 0.6 % (ref 0.0–3.0)
Eosinophils Absolute: 0.1 10*3/uL (ref 0.0–0.7)
Eosinophils Relative: 1.7 % (ref 0.0–5.0)
HCT: 38.1 % (ref 36.0–46.0)
Hemoglobin: 12.3 g/dL (ref 12.0–15.0)
Lymphocytes Relative: 38.8 % (ref 12.0–46.0)
Lymphs Abs: 2.1 10*3/uL (ref 0.7–4.0)
MCHC: 32.2 g/dL (ref 30.0–36.0)
MCV: 79.4 fl (ref 78.0–100.0)
Monocytes Absolute: 0.6 10*3/uL (ref 0.1–1.0)
Monocytes Relative: 10.9 % (ref 3.0–12.0)
Neutro Abs: 2.7 10*3/uL (ref 1.4–7.7)
Neutrophils Relative %: 48 % (ref 43.0–77.0)
Platelets: 315 10*3/uL (ref 150.0–400.0)
RBC: 4.8 Mil/uL (ref 3.87–5.11)
RDW: 15.2 % (ref 11.5–15.5)
WBC: 5.5 10*3/uL (ref 4.0–10.5)

## 2023-05-18 LAB — COMPREHENSIVE METABOLIC PANEL
ALT: 11 U/L (ref 0–35)
AST: 16 U/L (ref 0–37)
Albumin: 4.4 g/dL (ref 3.5–5.2)
Alkaline Phosphatase: 114 U/L (ref 39–117)
BUN: 13 mg/dL (ref 6–23)
CO2: 28 mEq/L (ref 19–32)
Calcium: 9.8 mg/dL (ref 8.4–10.5)
Chloride: 100 mEq/L (ref 96–112)
Creatinine, Ser: 0.68 mg/dL (ref 0.40–1.20)
GFR: 91.04 mL/min (ref >60.00)
Glucose, Bld: 80 mg/dL (ref 70–99)
Potassium: 4.7 mEq/L (ref 3.5–5.1)
Sodium: 136 mEq/L (ref 135–145)
Total Bilirubin: 0.3 mg/dL (ref 0.2–1.2)
Total Protein: 7.2 g/dL (ref 6.0–8.3)

## 2023-05-18 LAB — LIPID PANEL
Cholesterol: 235 mg/dL — ABNORMAL HIGH (ref 0–200)
HDL: 63.2 mg/dL (ref >39.00)
LDL Cholesterol: 135 mg/dL — ABNORMAL HIGH (ref 0–99)
NonHDL: 171.59
Total CHOL/HDL Ratio: 4
Triglycerides: 182 mg/dL — ABNORMAL HIGH (ref 0.0–149.0)
VLDL: 36.4 mg/dL (ref 0.0–40.0)

## 2023-05-18 LAB — LIPASE: Lipase: 42 U/L (ref 11.0–59.0)

## 2023-05-18 NOTE — Patient Instructions (Addendum)
It was great to see you!  Mammogram is important, please try to schedule this.  If you want to stop your Zoloft this summer, you can. You may experience some nausea, headache(s), lightheadedness when stopping this -- if no improvement within a week or two, restart medication Message me if any concerns  A referral has been placed for you to see one of our fantastic providers at [facility]. Someone from their office will be in touch soon regarding scheduling your appointment.  Their location:  [facility] at [facility]  [address] on the 1st floor Phone number [phone] Fax [phone].   This location is across the street from the entrance to [employer] and in the same complex as the [facility]   Please go to the lab for blood work.   Our office will call you with your results unless you have chosen to receive results via MyChart.  If your blood work is normal we will follow-up each year for physicals and as scheduled for chronic medical problems.  If anything is abnormal we will treat accordingly and get you in for a follow-up.  Take care,  Lelon Mast

## 2023-05-18 NOTE — Progress Notes (Signed)
Subjective:    Cheryl Weber is a 67 y.o. female and is here for a comprehensive physical exam.  HPI  Health Maintenance Due  Topic Date Due   HIV Screening  Never done   Hepatitis C Screening  Never done   DTaP/Tdap/Td (1 - Tdap) Never done   MAMMOGRAM  Never done   DEXA SCAN  Never done   With medical interpreter Marlen, today  Acute Concerns: Left upper quadrant of abdominal pain Started about 1 month ago 3-4 times per month - lasts for 5 minutes Pain is 5/10 Worried about her pancreas and digestive system -- has history of acute pancreatitis, H pylori, C diff Alcohol intake is -- minimal -- weekend 1-2 drinks Has lactose intolerance Denies nausea and vomiting and diarrhea/D/C  Taking omeprazole as needed -- this is helping her symptoms  Left shoulder pain Denies injury 2 months of pain - front of shoulder - does not radiate Denies any current repetitive motion Right handed Taking naproxen Denies weakness, numbness/tingling Seems to be worst at night  Chronic Issues: Anxiety Taking Zoloft 25 mg daily and tolerating well Patient is questioning whether or not she has seasonal mood disruptions and would like to stop her Zoloft during the summer as she feels really improved during the summer months Denies SI/HI  HTN Currently taking novrasc 5 mg. At home blood pressure readings are: normal. Patient denies chest pain, SOB, blurred vision, dizziness, unusual headaches, lower leg swelling. Patient is compliant with medication. Denies excessive caffeine intake, stimulant usage, excessive alcohol intake, or increase in salt consumption.  BP Readings from Last 3 Encounters:  05/18/23 130/84  09/05/22 118/80  11/06/21 130/70    Health Maintenance: Immunizations -- declines Tdap Colonoscopy -- UTD Mammogram -- overdue, agreeable to schedule PAP -- aged out Bone Density -- declines Diet --overall healthy diet Exercise --limited due to shoulder  pain  Sleep habits --denies any major concerns Mood --overall feels well  UTD with dentist? - yes UTD with eye doctor? - yes  Weight history: Wt Readings from Last 10 Encounters:  05/18/23 179 lb 3.2 oz (81.3 kg)  09/05/22 176 lb (79.8 kg)  11/06/21 169 lb 4 oz (76.8 kg)  04/02/21 167 lb 6.1 oz (75.9 kg)  02/26/21 172 lb 3.2 oz (78.1 kg)  01/28/21 171 lb 6.1 oz (77.7 kg)   Body mass index is 28.07 kg/m. No LMP recorded. Patient has had a hysterectomy.  Alcohol use:  reports current alcohol use of about 4.0 - 5.0 standard drinks of alcohol per week.  Tobacco use:  Tobacco Use: Medium Risk (05/18/2023)   Patient History    Smoking Tobacco Use: Former    Smokeless Tobacco Use: Never    Passive Exposure: Not on file   Eligible for lung cancer screening? no     05/18/2023   10:43 AM  Depression screen PHQ 2/9  Decreased Interest 0  Down, Depressed, Hopeless 0  PHQ - 2 Score 0  Altered sleeping 0  Tired, decreased energy 0  Change in appetite 0  Feeling bad or failure about yourself  0  Trouble concentrating 0  Moving slowly or fidgety/restless 0  Suicidal thoughts 0  PHQ-9 Score 0  Difficult doing work/chores Not difficult at all     Other providers/specialists: Patient Care Team: Jarold Motto, [place] as PCP - General (Physician Assistant)    PMHx, SurgHx, SocialHx, Medications, and Allergies were reviewed in the Visit Navigator and updated as appropriate.  Past Medical History:  Diagnosis Date   Allergy    Anxiety    Asthma    GERD (gastroesophageal reflux disease)    Hypertension    Vaginal delivery 1968, 1981     Past Surgical History:  Procedure Laterality Date   APPENDECTOMY     CESAREAN SECTION  1985   HYSTERECTOMY ABDOMINAL WITH SALPINGECTOMY     TONSILLECTOMY       Family History  Problem Relation Age of Onset   Transient ischemic attack Mother    Coronary artery disease Mother    Lung cancer Father    Cirrhosis Father    Alcohol  abuse Father     Social History   Tobacco Use   Smoking status: Former   Smokeless tobacco: Never   Tobacco comments:    quit 1987  Vaping Use   Vaping Use: Never used  Substance Use Topics   Alcohol use: Yes    Alcohol/week: 4.0 - 5.0 standard drinks of alcohol    Types: 4 - 5 Glasses of wine per week   Drug use: Never    Review of Systems:   Review of Systems  Constitutional:  Negative for chills, fever, malaise/fatigue and weight loss.  HENT:  Negative for hearing loss, sinus pain and sore throat.   Respiratory:  Negative for cough and hemoptysis.   Cardiovascular:  Negative for chest pain, palpitations, leg swelling and PND.  Gastrointestinal:  Positive for abdominal pain. Negative for constipation, diarrhea, heartburn, nausea and vomiting.  Genitourinary:  Negative for dysuria, frequency and urgency.  Musculoskeletal:  Positive for joint pain. Negative for back pain, myalgias and neck pain.  Skin:  Negative for itching and rash.  Neurological:  Negative for dizziness, tingling, seizures and headaches.  Endo/Heme/Allergies:  Negative for polydipsia.  Psychiatric/Behavioral:  Negative for depression. The patient is not nervous/anxious.     Objective:   BP 130/84   Pulse 62   Temp (!) 97.5 F (36.4 C)   Ht 5\' 7"  (1.702 m)   Wt 179 lb 3.2 oz (81.3 kg)   SpO2 99%   BMI 28.07 kg/m  Body mass index is 28.07 kg/m.   General Appearance:    Alert, cooperative, no distress, appears stated age  Head:    Normocephalic, without obvious abnormality, atraumatic  Eyes:    PERRL, conjunctiva/corneas clear, EOM's intact, fundi    benign, both eyes  Ears:    Normal TM's and external ear canals, both ears  Nose:   Nares normal, septum midline, mucosa normal, no drainage    or sinus tenderness  Throat:   Lips, mucosa, and tongue normal; teeth and gums normal  Neck:   Supple, symmetrical, trachea midline, no adenopathy;    thyroid:  no enlargement/tenderness/nodules; no  carotid   bruit or JVD  Back:     Symmetric, no curvature, ROM normal, no CVA tenderness  Lungs:     Clear to auscultation bilaterally, respirations unlabored  Chest Wall:    No tenderness or deformity   Heart:    Regular rate and rhythm, S1 and S2 normal, no murmur, rub or gallop  Breast Exam:    Deferred  Abdomen:     Soft, non-tender, bowel sounds active all four quadrants,    no masses, no organomegaly  Genitalia:    Deferred   Extremities:   Extremities normal, atraumatic, no cyanosis or edema Slight decreased range of motion with left overhead movements due to pain  Pulses:   2+ and  symmetric all extremities  Skin:   Skin color, texture, turgor normal, no rashes or lesions  Lymph nodes:   Cervical, supraclavicular, and axillary nodes normal  Neurologic:   CNII-XII intact, normal strength, sensation and reflexes    throughout    Assessment/Plan:   Encounter for general adult medical examination with abnormal findings Today patient counseled on age appropriate routine health concerns for screening and prevention, each reviewed and up to date or declined. Immunizations reviewed and up to date or declined. Labs ordered and reviewed. Risk factors for depression reviewed and negative. Hearing function and visual acuity are intact. ADLs screened and addressed as needed. Functional ability and level of safety reviewed and appropriate. Education, counseling and referrals performed based on assessed risks today. Patient provided with a copy of personalized plan for preventive services.  Anxiety Well-controlled We discussed trialing cessation of Zoloft during summer months She is going to stop medication to see if she does not need it during this time I did advise that she may have some nausea, headache, lightheadedness or other similar symptoms while stopping medication -I have asked her to reach out if these are concerning to her Also if she decides that she does not like how she feels not  being on Zoloft that she can restart at 25 mg daily Follow-up with [place] in 1 year, sooner if concerns. I discussed with patient that if they develop any SI, to tell someone immediately and seek medical attention.  Acute pain of left shoulder No red flags on my exam  I suspect some sort of rotator cuff issue We will place referral to sports medicine for further evaluation and management Likely would benefit from physical therapy  Overweight Continue efforts at healthy lifestyle  LUQ pain Unclear etiology No evidence of acute abdomen or any tenderness on my exam today I will update blood work to assess her lipase and for H. pylori per her request Recommend that she continue to monitor symptoms and if they persist or worsen we will refer to GI versus perform imaging  Primary hypertension Normotensive today Continue amlodipine 5 mg daily Follow-up in 1 year, sooner if concerns   Jarold Motto, PA-C [facility]

## 2023-05-19 ENCOUNTER — Encounter: Payer: Self-pay | Admitting: Physician Assistant

## 2023-05-19 NOTE — Telephone Encounter (Signed)
See note

## 2023-05-21 ENCOUNTER — Ambulatory Visit: Payer: Commercial Managed Care - HMO | Admitting: Sports Medicine

## 2023-05-25 LAB — H. PYLORI BREATH TEST: H. pylori Breath Test: NOT DETECTED

## 2023-06-10 ENCOUNTER — Other Ambulatory Visit: Payer: Self-pay | Admitting: Physician Assistant

## 2023-06-15 ENCOUNTER — Other Ambulatory Visit: Payer: Self-pay | Admitting: *Deleted

## 2023-06-15 DIAGNOSIS — M25512 Pain in left shoulder: Secondary | ICD-10-CM

## 2023-06-17 ENCOUNTER — Other Ambulatory Visit: Payer: Self-pay | Admitting: Physician Assistant

## 2023-06-17 DIAGNOSIS — Z1231 Encounter for screening mammogram for malignant neoplasm of breast: Secondary | ICD-10-CM

## 2023-06-18 ENCOUNTER — Ambulatory Visit (INDEPENDENT_AMBULATORY_CARE_PROVIDER_SITE_OTHER): Payer: Commercial Managed Care - HMO | Admitting: Family Medicine

## 2023-06-18 ENCOUNTER — Ambulatory Visit (INDEPENDENT_AMBULATORY_CARE_PROVIDER_SITE_OTHER): Payer: Commercial Managed Care - HMO

## 2023-06-18 ENCOUNTER — Other Ambulatory Visit: Payer: Self-pay

## 2023-06-18 VITALS — HR 72 | Wt 180.0 lb

## 2023-06-18 DIAGNOSIS — G8929 Other chronic pain: Secondary | ICD-10-CM

## 2023-06-18 DIAGNOSIS — M25512 Pain in left shoulder: Secondary | ICD-10-CM

## 2023-06-18 NOTE — Patient Instructions (Signed)
Thank you for coming in today.   Please get an Xray today before you leave   I've referred you to Physical Therapy.  Let us know if you don't hear from them in one week.   Recheck in about 6 weeks.

## 2023-06-18 NOTE — Progress Notes (Signed)
Rubin Payor, PhD, LAT, ATC acting as a scribe for Clementeen Graham, MD.  Cheryl Weber is a 67 y.o. female who presents to [facility] at [facility] today for L shoulder pain ongoing for about 3 months. RHD. No injury. Pain is bothersome at night. Pt locates pain to deep to the L scapula and into the L shoulder.   Neck pain: no Radiates: yes- into upper arm and around the L scapula Aggravates: should aBd- esp over 90, IR Treatments tried: Salon pas, naproxen  Pertinent review of systems: No fevers or notes  Relevant historical information: Hypertension Works as a Games developer job.   Exam:  Pulse 72   Wt 180 lb (81.6 kg)   SpO2 97%   BMI 28.19 kg/m  General: Well Developed, well nourished, and in no acute distress.   MSK: Left shoulder: Normal-appearing Range of motion abduction limited to 130 degrees with pain.  Functional internal rotation limited to lumbar spine.  External rotation is full. Strength 4/5 abduction 5/5 external and internal rotation. Positive Hawkins and Neer's test. Positive empty can test. Negative Yergason's and speeds test.    Lab and Radiology Results  Procedure: Real-time Ultrasound Guided Injection of left shoulder subacromial bursa Device: Philips Affiniti 50G/GE Logiq Images permanently stored and available for review in PACS Verbal informed consent obtained.  Discussed risks and benefits of procedure. Warned about infection, bleeding, hyperglycemia damage to structures among others. Patient expresses understanding and agreement Time-out conducted.   Noted no overlying erythema, induration, or other signs of local infection.   Skin prepped in a sterile fashion.   Local anesthesia: Topical Ethyl chloride.   With sterile technique and under real time ultrasound guidance: 40 mg of Kenalog and 2 mL of Marcaine injected into subacromial bursa. Fluid seen entering the bursa.   Completed without difficulty   Pain  immediately resolved suggesting accurate placement of the medication.   Advised to call if fevers/chills, erythema, induration, drainage, or persistent bleeding.   Images permanently stored and available for review in the ultrasound unit.  Impression: Technically successful ultrasound guided injection.   X-ray images left shoulder obtained today personally and independently interpreted Minimal glenohumeral DJD. Await formal radiology review     Assessment and Plan: 67 y.o. female with left shoulder pain thought to be due to subacromial bursitis and impingement.  She may have some rotator cuff tendinitis or even a possible small tear that I did not see on ultrasound.  Plan for injection today and physical therapy referral.  Recheck in 6 weeks.  Today's visit conducted using a Spanish interpreter.   PDMP not reviewed this encounter. Orders Placed This Encounter  Procedures   [place] LIMITED JOINT SPACE STRUCTURES UP LEFT(NO LINKED CHARGES)    Order Specific Question:   Reason for Exam (SYMPTOM  OR DIAGNOSIS REQUIRED)    Answer:   left shoulder pain    Order Specific Question:   Preferred imaging location?    Answer:   [facility] Shoulder Left    Standing Status:   Future    Standing Expiration Date:   07/19/2023    Order Specific Question:   Reason for Exam (SYMPTOM  OR DIAGNOSIS REQUIRED)    Answer:   left shoulder pain    Order Specific Question:   Preferred imaging location?    Answer:   [facility]   Ambulatory referral to Physical Therapy    Referral Priority:  Routine    Referral Type:   Physical Medicine    Referral Reason:   Specialty Services Required    Requested Specialty:   Physical Therapy    Number of Visits Requested:   1   No orders of the defined types were placed in this encounter.    Discussed warning signs or symptoms. Please see discharge instructions. Patient expresses understanding.   The above documentation has  been reviewed and is accurate and complete Clementeen Graham, M.D.

## 2023-06-25 ENCOUNTER — Ambulatory Visit (INDEPENDENT_AMBULATORY_CARE_PROVIDER_SITE_OTHER): Payer: Commercial Managed Care - HMO | Admitting: Physical Therapy

## 2023-06-25 ENCOUNTER — Encounter: Payer: Self-pay | Admitting: Physical Therapy

## 2023-06-25 DIAGNOSIS — M25512 Pain in left shoulder: Secondary | ICD-10-CM | POA: Diagnosis not present

## 2023-06-25 NOTE — Therapy (Addendum)
OUTPATIENT PHYSICAL THERAPY UPPER EXTREMITY TREATMENT    Patient Name: Cheryl Weber MRN: [id number] DOB:February 12, 1956, 67 y.o., female Today's Date: 06/25/2023  END OF SESSION:  PT End of Session - 06/25/23 2108     Visit Number 1    Number of Visits 16    Date for PT Re-Evaluation 08/20/23    Authorization Type [employer]    PT Start Time 1430    PT Stop Time 1505    PT Time Calculation (min) 35 min    Activity Tolerance Patient tolerated treatment well    Behavior During Therapy WFL for tasks assessed/performed             Past Medical History:  Diagnosis Date   Allergy    Anxiety    Asthma    GERD (gastroesophageal reflux disease)    Hypertension    Vaginal delivery 1968, 1981   Past Surgical History:  Procedure Laterality Date   APPENDECTOMY     CESAREAN SECTION  1985   HYSTERECTOMY ABDOMINAL WITH SALPINGECTOMY     TONSILLECTOMY     Patient Active Problem List   Diagnosis Date Noted   Gastroesophageal reflux disease 01/28/2021   History of Helicobacter pylori infection 01/28/2021   Anemia 05/11/2018   Hypertension 05/11/2018   Idiopathic acute pancreatitis 05/11/2018   Insomnia 05/11/2018    PCP: Jarold Motto  REFERRING PROVIDER: Clementeen Graham   REFERRING DIAG: L shoulder pain   THERAPY DIAG:  Acute pain of left shoulder  Rationale for Evaluation and Treatment: Rehabilitation  ONSET DATE:  3 mo ago  SUBJECTIVE:                                                                                                                                                                                      SUBJECTIVE STATEMENT:  Pt states pain in L shoulder, that started about 3 months ago ,no incident to report.    She Did have injection last week, feels it has helped quite a bit.  She works 3-4 hours/day, works remote on [employer].  Hand dominance: Right  PERTINENT HISTORY: None   PAIN:  Are you having pain? Yes: NPRS scale: 4-5 /10 Pain  location: L shoulder  Pain description: sore Aggravating factors: Reaching up, out, and behind the back Relieving factors: injection   PRECAUTIONS: None  RED FLAGS: None   WEIGHT BEARING RESTRICTIONS: No  FALLS:  Has patient fallen in last 6 months? No  PLOF: Independent  PATIENT GOALS: decreased pain   NEXT MD VISIT:   OBJECTIVE:   DIAGNOSTIC FINDINGS:    PATIENT SURVEYS :  FOTO    COGNITION: Overall cognitive  status: Within functional limits for tasks assessed     SENSATION: WFL  POSTURE: Wfl   UPPER EXTREMITY ROM:   Active ROM Right eval Left eval  Shoulder flexion wfl 125  Shoulder extension    Shoulder abduction wfl 125  Shoulder adduction    Shoulder internal rotation wfl L1/pain  Shoulder external rotation    Elbow flexion    Elbow extension    Wrist flexion    Wrist extension    Wrist ulnar deviation    Wrist radial deviation    Wrist pronation    Wrist supination    (Blank rows = not tested)  UPPER EXTREMITY MMT:  MMT Right eval Left eval  Shoulder flexion wfl 3-  Shoulder extension    Shoulder abduction wfl 3-  Shoulder adduction    Shoulder internal rotation  4  Shoulder external rotation  4  Middle trapezius    Lower trapezius    Elbow flexion    Elbow extension    Wrist flexion    Wrist extension    Wrist ulnar deviation    Wrist radial deviation    Wrist pronation    Wrist supination    Grip strength (lbs)    (Blank rows = not tested)  SHOULDER SPECIAL TESTS:  JOINT MOBILITY TESTING:  WFL, pain at mid to end range ER   PALPATION:  Pain in posterior shoulder , supraspinatus, and infra/teres    TODAY'S TREATMENT:                                                                                                                                         DATE:   06/25/2023 Ther ex: See below for HEP  PATIENT EDUCATION:  Education details: PT POC, Exam findings, HEP Person educated: Patient Education method:  Explanation, Demonstration, Tactile cues, Verbal cues, and Handouts Education comprehension: verbalized understanding, returned demonstration, verbal cues required, tactile cues required, and needs further education   HOME EXERCISE PROGRAM: Access Code: [email or web address] URL: [email or web address] Date: 06/25/2023 Prepared by: Sedalia Muta  Exercises - Supine Shoulder Flexion Extension AAROM with Dowel  - 1 x daily - 1 sets - 10 reps - 3 hold - Sidelying Shoulder External Rotation  - 1 x daily - 1- 2 sets - 10 reps    ASSESSMENT:  CLINICAL IMPRESSION: Patient presents with primary complaint of increased pain in L shoulder. She has decreased ability for full ROM due to pain. She has decreased strength and  limited ability for functional activity, reaching, lifting, carrying and IADLs. Pt to benefit from skilled PT to improve deficits and pain.    OBJECTIVE IMPAIRMENTS: decreased activity tolerance, decreased ROM, decreased strength, increased muscle spasms, impaired UE functional use, improper body mechanics, and pain.   ACTIVITY LIMITATIONS: carrying, lifting, bed mobility, reach over head, hygiene/grooming, locomotion level, and caring for others  PARTICIPATION LIMITATIONS: meal prep, cleaning, laundry, driving, shopping, community  activity, occupation, and yard work  PERSONAL FACTORS:  none  are also affecting patient's functional outcome.   REHAB POTENTIAL: Good  CLINICAL DECISION MAKING: Stable/uncomplicated  EVALUATION COMPLEXITY: Low  GOALS: Goals reviewed with patient? Yes   SHORT TERM GOALS: Target date: 07/09/2023   Pt to be independent with initial HEP  Goal status: INITIAL    LONG TERM GOALS: Target date:08/20/2023   Pt to be independent with final HEP  Goal status: INITIAL  2.  Pt to report decreased pain L shoulder to 0-2/10 with elevation and activity   Goal status: INITIAL  3.  Pt to demo full, pain free ROM of L shoulder, to improve  ability for ADLS.   Goal status: INITIAL  4.  Pt to demo increased strength of L shoulder to at least 4/5 for all motions, to improve ability for reach, lift, carry and IADLS.   Goal status: INITIAL   PLAN: PT FREQUENCY: 1-2x/week  PT DURATION: 8 weeks  PLANNED INTERVENTIONS: Therapeutic exercises, Therapeutic activity, Neuromuscular re-education, Patient/Family education, Self Care, Joint mobilization, Joint manipulation, Stair training, Orthotic/Fit training, DME instructions, Aquatic Therapy, Dry Needling, Electrical stimulation, Cryotherapy, Moist heat, Taping, Ultrasound, Ionotophoresis 4mg /ml Dexamethasone, Manual therapy,  Vasopneumatic device, Traction, Spinal manipulation, Spinal mobilization,Balance training, Gait training,   PLAN FOR NEXT SESSION:    Sedalia Muta, PT, DPT 9:48 PM  06/25/23  PHYSICAL THERAPY DISCHARGE SUMMARY  Visits from Start of Care: 1   Plan: Patient agrees to discharge.  Patient goals were not met. Patient is being discharged due to - not returning since last visit.    Sedalia Muta, PT, DPT 9:48 AM  09/02/23

## 2023-06-29 NOTE — Progress Notes (Signed)
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

## 2023-07-01 ENCOUNTER — Ambulatory Visit: Payer: Commercial Managed Care - HMO

## 2023-07-05 ENCOUNTER — Other Ambulatory Visit: Payer: Self-pay | Admitting: Physician Assistant

## 2023-07-07 ENCOUNTER — Ambulatory Visit: Payer: Medicaid Other

## 2023-07-09 ENCOUNTER — Other Ambulatory Visit: Payer: Self-pay | Admitting: Physician Assistant

## 2023-07-09 ENCOUNTER — Encounter: Payer: Commercial Managed Care - HMO | Admitting: Physical Therapy

## 2023-07-20 ENCOUNTER — Encounter: Payer: Commercial Managed Care - HMO | Admitting: Physical Therapy

## 2023-07-30 ENCOUNTER — Ambulatory Visit: Payer: Commercial Managed Care - HMO | Admitting: Family Medicine

## 2023-08-18 ENCOUNTER — Encounter: Payer: Self-pay | Admitting: Physician Assistant

## 2023-08-18 DIAGNOSIS — J45909 Unspecified asthma, uncomplicated: Secondary | ICD-10-CM

## 2023-08-19 ENCOUNTER — Ambulatory Visit
Admission: RE | Admit: 2023-08-19 | Discharge: 2023-08-19 | Disposition: A | Discharge status: Home / Self Care | Payer: Medicaid Other | Source: Ambulatory Visit | Attending: Physician Assistant

## 2023-08-19 DIAGNOSIS — Z1231 Encounter for screening mammogram for malignant neoplasm of breast: Secondary | ICD-10-CM

## 2023-08-25 ENCOUNTER — Encounter (HOSPITAL_BASED_OUTPATIENT_CLINIC_OR_DEPARTMENT_OTHER): Payer: Self-pay

## 2023-11-02 ENCOUNTER — Encounter: Payer: Self-pay | Admitting: Internal Medicine

## 2023-11-02 ENCOUNTER — Ambulatory Visit: Payer: Commercial Managed Care - HMO | Admitting: Internal Medicine

## 2023-11-02 VITALS — BP 121/63 | HR 79 | Ht 66.0 in | Wt 181.8 lb

## 2023-11-02 DIAGNOSIS — J4540 Moderate persistent asthma, uncomplicated: Secondary | ICD-10-CM

## 2023-11-02 DIAGNOSIS — J301 Allergic rhinitis due to pollen: Secondary | ICD-10-CM

## 2023-11-02 LAB — POCT EXHALED NITRIC OXIDE: FeNO level (ppb): 20

## 2023-11-02 MED ORDER — FLUTICASONE FUROATE-VILANTEROL 100-25 MCG/ACT IN AEPB: 1.0000 | INHALATION_SPRAY | Freq: Every day | RESPIRATORY_TRACT | 5 refills | Status: DC

## 2023-11-02 MED ORDER — AZELASTINE-FLUTICASONE 137-50 MCG/ACT NA SUSP: 1.0000 | Freq: Every day | NASAL | 5 refills | Status: DC

## 2023-11-02 NOTE — Progress Notes (Signed)
Cheryl Weber [place]    [id number]    1956-10-15  Primary Care Physician:Weber, Cheryl Mast, [place]  Referring Physician: Jarold Motto, PA [address] [place],  [place] [address] Reason for Consultation: "breathing issues, spots on her lungs" Date of Consultation: 11/02/2023  Chief complaint:   Chief Complaint  Patient presents with   Consult    Pt states, lately she has been having issues with her breathing. Pt had the flu shot and had a bad asthma reaction. Using her nebs and inhaler, which did not help much. Pt did mention having 2 "spots " on her lungs      HPI: Conducted with spanish interpreter.  Cheryl Weber is a 67 y.o. woman who presents for new patient evaluation for "spots on her lungs" and allergies.   She notes always having issues with allergies and notes frequent "infections" with asthma. These used to be more frequent.  She has been diagnosed with asthma and takes albuterol only with MDI and nebulizer.   After her flu shot she had a couple of weeks of shortness of breath and wheezing. She was taking albuterol 4-5 times/day but this is has since resolved. However when she scheduled the appointment she was having symptoms.   4-5 years ago she was needing steroids/abx about twice a year.   Triggers - flu shot, seasonal environmental allergies, strong smells like chemicals  Has had allergy testing - dust mites, grass, tree pollen. Takes prn anti-histamine with zyrtec.   Additionally she has some concerns about "spots" on her lungs. She does not have any records. She was told they need to be followed annually.    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   Exposures: has been in [place] for 5-6 years. From [place] Smoking history: had remote brief smoking history over 30 years ago.  Social History   Occupational History   Not on file  Tobacco Use   Smoking  status: Never   Smokeless tobacco: Never   Tobacco comments:    quit 1987  Vaping Use   Vaping status: Never Used  Substance and Sexual Activity   Alcohol use: Yes    Alcohol/week: 4.0 - 5.0 standard drinks of alcohol    Types: 4 - 5 Glasses of wine per week   Drug use: Never   Sexual activity: Yes    Birth control/protection: Surgical    Relevant family history:  Family History  Problem Relation Age of Onset   Transient ischemic attack Mother    Coronary artery disease Mother    Asthma Mother    Lung cancer Father    Cirrhosis Father    Alcohol abuse Father    Asthma Child    Environmental Allergies Child     Past Medical History:  Diagnosis Date   Allergy    Anxiety    Asthma    GERD (gastroesophageal reflux disease)    Hypertension    Vaginal delivery 1968, 1981    Past Surgical History:  Procedure Laterality Date   APPENDECTOMY     CESAREAN SECTION  1985   HYSTERECTOMY ABDOMINAL WITH SALPINGECTOMY     TONSILLECTOMY       Physical Exam: Blood pressure 121/63, pulse 79, height 5\' 6"  (1.676 m), weight 181 lb 12.8 oz (82.5 kg), SpO2 99%. Gen:      No acute distress ENT:  +cobblestoning and nasal debris, no  nasal polyps, mucus membranes moist Lungs:    No increased respiratory effort, symmetric chest wall excursion, clear to auscultation bilaterally, no wheezes or crackles CV:         Regular rate and rhythm; no murmurs, rubs, or gallops.  No pedal edema Abd:      + bowel sounds; soft, non-tender; no distension MSK: no acute synovitis of DIP or PIP joints, no mechanics hands.  Skin:      Warm and dry; no rashes Neuro: normal speech, no focal facial asymmetry Psych: alert and oriented x3, normal mood and affect   Data Reviewed/Medical Decision Making:  Independent interpretation of tests: Imaging:  Review of patient's chest xray Feb 2022 images revealed no acute cardiopulmonary process. The patient's images have been independently reviewed by me.     PFTs:    Labs: absolute eosinophil count 100  Lab Results  Component Value Date   NA 136 05/18/2023   K 4.7 05/18/2023   CO2 28 05/18/2023   GLUCOSE 80 05/18/2023   BUN 13 05/18/2023   CREATININE 0.68 05/18/2023   CALCIUM 9.8 05/18/2023   GFR 91.04 05/18/2023   Lab Results  Component Value Date   WBC 5.5 05/18/2023   HGB 12.3 05/18/2023   HCT 38.1 05/18/2023   MCV 79.4 05/18/2023   PLT 315.0 05/18/2023      Immunization status:  Immunization History  Administered Date(s) Administered   Fluad Quad(high Dose 65+) 08/10/2022   Influenza,inj,Quad PF,6+ Mos 09/09/2019   Influenza-Unspecified 08/31/2020, 10/07/2021   PFIZER(Purple Top)SARS-COV-2 Vaccination 02/10/2020, 03/05/2020, 09/18/2020, 07/06/2021   PNEUMOCOCCAL CONJUGATE-20 02/03/2021   Pfizer Covid-19 Vaccine Bivalent Booster 23yrs & up 04/11/2022   Zoster Recombinant(Shingrix) 02/03/2021, 04/11/2022     I reviewed prior external note(s) from family medicine  I reviewed the result(s) of the labs and imaging as noted above.   I have ordered feno   Assessment:  Moderate persistent asthma, not well controlled  Plan/Recommendations:  Start Breo one inhalation once daily, gargle after use. Use this even if you are feeling good.   Continue the albuterol inhaler as needed.   We will also start dymista nasal spray to help your allergies. Improving your allergies will improve your asthma control.   Your chest xray from 2022 looked normal without any spots on the lung. I do not think we need to continue any further CT scans based on this.    Return to Care: Return in about 4 months (around 03/02/2024).  Durel Salts, MD Pulmonary and Critical Care Medicine [facility] [phone]  CC: Cheryl Weber, [place]

## 2023-11-02 NOTE — Patient Instructions (Addendum)
It was a pleasure to see you today!  Please schedule follow up scheduled with myself in 4 months.  If my schedule is not open yet, we will contact you with a reminder closer to that time. Please call [phone] if you haven't heard from [place] a month before, and always call us sooner if issues or concerns arise. You can also send [place] a message through MyChart, but but aware that this is not to be used for urgent issues and it may take up to 5-7 days to receive a reply. Please be aware that you will likely be able to view your results before I have a chance to respond to them. Please give [place] 5 business days to respond to any non-urgent results.   Start Breo one inhalation once daily, gargle after use. Use this even if you are feeling good.   Continue the albuterol inhaler as needed.   We will also start dymista nasal spray to help your allergies. Improving your allergies will improve your asthma control.   Your chest xray from 2022 looked normal without any spots on the lung. I do not think we need to continue any further CT scans based on this.   Dymista - 1 spray on each side of your nose twice a day for first week, then 1 spray on each side.   Instructions for use: If you also use a saline nasal spray or rinse, use that first. Position the head with the chin slightly tucked. Use the right hand to spray into the left nostril and the right hand to spray into the left nostril.   Point the bottle away from the septum of your nose (cartilage that divides the two sides of your nose).  Hold the nostril closed on the opposite side from where you will spray Spray once and gently sniff to pull the medicine into the higher parts of your nose.  Don't sniff too hard as the medicine will drain down the back of your throat instead. Repeat with a second spray on the same side if prescribed. Repeat on the other side of your nose.  Ha sido un placer verte hoy!  Por favor, programe un seguimiento programado  conmigo en 4 meses.  Si mi agenda an no est abierta, nos pondremos en contacto con usted para recordarle ms cerca de esa hora. Llame al [phone] si no ha tenido noticias nuestras un mes antes, y siempre llmenos antes si surgen problemas o inquietudes. Tambin puede enviarnos un mensaje a travs de MyChart, pero tenga en cuenta que esto no debe usarse para problemas urgentes y Research officer, trade union [address]  [address] recibir [place]. Tenga en cuenta que es probable que pueda ver sus resultados antes de que tenga la oportunidad de responderles. Por favor, danos 5 das hbiles para responder a cualquier resultado que no sea urgente.   Comience Breo una inhalacin una vez al da, haga grgaras despus de usarlo. salo incluso si te sientes bien.   Contine con el inhalador de albuterol segn sea necesario.   Tambin comenzaremos con el spray nasal dymista para ayudar con sus alergias. Mejorar sus alergias mejorar su control del asma.   La radiografa de trax de 2022 pareca normal, sin manchas en el pulmn. No creo que necesitemos continuar con ms tomografas computarizadas basadas en esto.   Dymista - 1 pulverizacin en cada lado de la [employer] al da durante la primera [place], luego 1 pulverizacin en cada lado.   Instrucciones  de uso:  ? Si tambin [place] un aerosol nasal o enjuague nasal de solucin salina, selo primero.  ? Coloca la cabeza con la barbilla ligeramente metida. Use la mano derecha para rociar en la fosa nasal izquierda y la mano derecha para rociar en la fosa nasal izquierda.   Apunte el frasco lejos del tabique de la nariz (cartlago que divide [employer] de la [place]).   ? Mantenga la fosa nasal cerrada en el lado opuesto de donde rociar  ? Roce una vez y huela suavemente para llevar el medicamento a las partes superiores de la Clinical cytogeneticist.  No huela demasiado fuerte, ya que el medicamento se escurrir por la parte posterior de la garganta.  ? Repita con un segundo rociado  en el mismo lado si se lo recetaron.  ? Repite el procedimiento en el otro lado de la nariz.

## 2023-11-02 NOTE — Progress Notes (Signed)
The patient has been prescribed the inhaler Breo. Inhaler technique was demonstrated to patient. The patient subsequently demonstrated correct technique.   

## 2024-01-02 ENCOUNTER — Other Ambulatory Visit: Payer: Self-pay | Admitting: Physician Assistant

## 2024-01-04 ENCOUNTER — Other Ambulatory Visit: Payer: Self-pay | Admitting: Physician Assistant

## 2024-03-01 ENCOUNTER — Encounter: Payer: Self-pay | Admitting: Internal Medicine

## 2024-03-01 ENCOUNTER — Ambulatory Visit (INDEPENDENT_AMBULATORY_CARE_PROVIDER_SITE_OTHER): Payer: Medicaid Other | Admitting: Internal Medicine

## 2024-03-01 VITALS — BP 130/80 | HR 74 | Ht 66.0 in | Wt 178.8 lb

## 2024-03-01 DIAGNOSIS — J301 Allergic rhinitis due to pollen: Secondary | ICD-10-CM | POA: Diagnosis not present

## 2024-03-01 DIAGNOSIS — J4520 Mild intermittent asthma, uncomplicated: Secondary | ICD-10-CM | POA: Diagnosis not present

## 2024-03-01 MED ORDER — AZELASTINE HCL 0.1 % NA SOLN: 1.0000 | Freq: Two times a day (BID) | NASAL | 12 refills | Status: AC

## 2024-03-01 NOTE — Patient Instructions (Addendum)
 It was a pleasure to see you today!  Please schedule follow up scheduled with myself in 1 year.  If my schedule is not open yet, we will contact you with a reminder closer to that time. Please call [phone] if you haven't heard from [place] a month before, and always call us sooner if issues or concerns arise. You can also send [place] a message through MyChart, but but aware that this is not to be used for urgent issues and it may take up to 5-7 days to receive a reply. Please be aware that you will likely be able to view your results before I have a chance to respond to them. Please give [place] 5 business days to respond to any non-urgent results.   Continue the Breo inhaler.  Because you do not have daily symptoms, using this inhaler on an as needed basis may be sufficient.   I recommend starting to use the inhaler daily as prescribed if you have symptoms of asthma such as chest tightness, wheezing, coughing, shortness of breath. You should also start using it if you have exposure to a sick contact, worsening allergies, or any other trigger for your asthma. I recommend you keep using it even after your respiratory symptoms resolve for 3-4 days. The goal of this therapy is to prevent your symptoms from becoming a flare severe enough to require steroids like prednisone.   Please call our office if using this inhaler on an as needed basis is not sufficient.  If breathing worsens I would recommend you start using it daily as prescribed.  And then call our office for an appointment.  For your allergies you can continue the cetirizine.  You can also add Astelin nasal spray.  This is an antihistamine and does not contain any steroids.  You can take this in addition to the cetirizine.  astelin - 1 spray on each side of your nose twice a day for first week, then 1 spray on each side.   Instructions for use: If you also use a saline nasal spray or rinse, use that first. Position the head with the chin slightly  tucked. Use the right hand to spray into the left nostril and the right hand to spray into the left nostril.   Point the bottle away from the septum of your nose (cartilage that divides the two sides of your nose).  Hold the nostril closed on the opposite side from where you will spray Spray once and gently sniff to pull the medicine into the higher parts of your nose.  Don't sniff too hard as the medicine will drain down the back of your throat instead. Repeat with a second spray on the same side if prescribed. Repeat on the other side of your nose.  Contine con Paediatric nurse.  Debido a que no tiene sntomas diarios, puede ser suficiente usar Production assistant, radio segn sea necesario.   Recomiendo comenzar a Tax adviser segn lo prescrito si tiene sntomas de asma como opresin en el pecho, sibilancias, tos o dificultad para respirar. Tambin debe comenzar a usarlo si tiene exposicin a un contacto enfermo, empeoramiento de las alergias o cualquier otro desencadenante de su asma. Le recomiendo que siga usndolo incluso despus de que sus sntomas respiratorios desaparezcan durante 3-4 das. El objetivo de esta terapia es evitar que sus sntomas se conviertan en un brote lo suficientemente grave como para requerir esteroides como la prednisona.   Llame a nuestra oficina si usar [employer]  inhalador segn sea necesario no es suficiente.  Si la respiracin empeora, le recomendara que empiece a usarlo diariamente segn lo prescrito.  Y luego llame a nuestra oficina para programar una cita.  Para sus alergias puede continuar con la cetirizina.  Tambin puede agregar el aerosol nasal Astelin.  Este es un antihistamnico y no contiene esteroides.  Puede tomar esto adems de la cetirizina.

## 2024-03-01 NOTE — Progress Notes (Signed)
 [address] Cheryl Weber    [id number]    [birth year]  Primary Care Physician:Worley, Lelon Mast, [place] Date of Appointment: 03/01/2024 Established Patient Visit  Chief complaint:   Chief Complaint  Patient presents with   Follow-up    Doing good. Seasonal allergie     HPI: Cheryl Weber is a 68 y.o. woman with moderate persistent asthma.   Interval Updates: Here for follow up after starting breo inhaler. She feels much better on the breo. She is having seasonal allergic rhinitis and takes cetirizine 1 pill once nightly. Still having ongoing drainage. She actually stopped the breo after 2 monts because she didn't know she was supposed to keep taking it.   She does not take dysmista nasal spray due to concern for taking even non-systemic steroids.  Asthma Control Test ACT Total Score  03/01/2024 10:48 AM 20    I have reviewed the patient's family social and past medical history and updated as appropriate.   Past Medical History:  Diagnosis Date   Allergy    Anxiety    Asthma    GERD (gastroesophageal reflux disease)    Hypertension    Vaginal delivery 1968, 1981    Past Surgical History:  Procedure Laterality Date   APPENDECTOMY     CESAREAN SECTION  1985   HYSTERECTOMY ABDOMINAL WITH SALPINGECTOMY     TONSILLECTOMY      Family History  Problem Relation Age of Onset   Transient ischemic attack Mother    Coronary artery disease Mother    Asthma Mother    Lung cancer Father    Cirrhosis Father    Alcohol abuse Father    Asthma Child    Environmental Allergies Child     Social History   Occupational History   Not on file  Tobacco Use   Smoking status: Former    Types: Cigarettes   Smokeless tobacco: Never   Tobacco comments:    quit 1987  Vaping Use   Vaping status: Never Used  Substance and Sexual Activity   Alcohol use: Yes    Alcohol/week: 4.0 - 5.0 standard drinks of alcohol    Types: 4 - 5 Glasses of wine per week   Drug  use: Never   Sexual activity: Yes    Birth control/protection: Surgical     Physical Exam: Blood pressure 130/80, pulse 74, height 5\' 6"  (1.676 m), weight 178 lb 12.8 oz (81.1 kg), SpO2 96%.  Gen:      No acute distress ENT:  cobblestonining, mild nasal debris no nasal polyps, mucus membranes moist Lungs:    No increased respiratory effort, symmetric chest wall excursion, clear to auscultation bilaterally, no wheezes or crackles CV:         Regular rate and rhythm; no murmurs, rubs, or gallops.  No pedal edema   Data Reviewed: Imaging: I have personally reviewed the   PFTs:      No data to display         I have personally reviewed the patient's PFTs and   Labs: Lab Results  Component Value Date   NA 136 05/18/2023   K 4.7 05/18/2023   CO2 28 05/18/2023   GLUCOSE 80 05/18/2023   BUN 13 05/18/2023   CREATININE 0.68 05/18/2023   CALCIUM 9.8 05/18/2023   GFR 91.04 05/18/2023   Lab Results  Component Value Date   WBC 5.5 05/18/2023   HGB 12.3 05/18/2023  HCT 38.1 05/18/2023   MCV 79.4 05/18/2023   PLT 315.0 05/18/2023    Immunization status: Immunization History  Administered Date(s) Administered   Fluad Quad(high Dose 65+) 08/10/2022   Influenza,inj,Quad PF,6+ Mos 09/09/2019   Influenza-Unspecified 08/31/2020, 10/07/2021   PFIZER(Purple Top)SARS-COV-2 Vaccination 02/10/2020, 03/05/2020, 09/18/2020, 07/06/2021   PNEUMOCOCCAL CONJUGATE-20 02/03/2021   Pfizer Covid-19 Vaccine Bivalent Booster 69yrs & up 04/11/2022   Zoster Recombinant(Shingrix) 02/03/2021, 04/11/2022    External Records Personally Reviewed:   Assessment:  Mild persistent asthma, well controlled Seasonal allergic rhinitis, not well controlled.  Plan/Recommendations:  Continue the [employer] inhaler.  Because you do not have daily symptoms, using this inhaler on an as needed basis may be sufficient.   For your allergies you can continue the cetirizine.  You can also add Astelin nasal spray.   This is an antihistamine and does not contain any steroids.  You can take this in addition to the cetirizine.   Return to Care: Return in about 1 year (around 03/01/2025).   Durel Salts, MD Pulmonary and Critical Care Medicine [facility] Office:[phone]

## 2024-04-03 ENCOUNTER — Other Ambulatory Visit: Payer: Self-pay | Admitting: Physician Assistant

## 2024-05-18 ENCOUNTER — Encounter: Payer: Commercial Managed Care - HMO | Admitting: Physician Assistant

## 2024-06-16 ENCOUNTER — Encounter: Payer: Self-pay | Admitting: Physician Assistant

## 2024-06-16 DIAGNOSIS — J45909 Unspecified asthma, uncomplicated: Secondary | ICD-10-CM

## 2024-06-16 DIAGNOSIS — Z9109 Other allergy status, other than to drugs and biological substances: Secondary | ICD-10-CM

## 2024-06-16 NOTE — Telephone Encounter (Signed)
 Okay for referral to Dermatologist.

## 2024-07-03 ENCOUNTER — Other Ambulatory Visit: Payer: Self-pay | Admitting: Physician Assistant

## 2024-09-30 ENCOUNTER — Ambulatory Visit (INDEPENDENT_AMBULATORY_CARE_PROVIDER_SITE_OTHER): Admitting: Internal Medicine

## 2024-09-30 ENCOUNTER — Other Ambulatory Visit: Payer: Self-pay

## 2024-09-30 VITALS — BP 120/76 | HR 83 | Temp 98.0°F | Ht 63.5 in | Wt 175.2 lb

## 2024-09-30 DIAGNOSIS — R21 Rash and other nonspecific skin eruption: Secondary | ICD-10-CM | POA: Diagnosis not present

## 2024-09-30 DIAGNOSIS — J3089 Other allergic rhinitis: Secondary | ICD-10-CM | POA: Diagnosis not present

## 2024-09-30 DIAGNOSIS — J4530 Mild persistent asthma, uncomplicated: Secondary | ICD-10-CM

## 2024-09-30 DIAGNOSIS — K279 Peptic ulcer, site unspecified, unspecified as acute or chronic, without hemorrhage or perforation: Secondary | ICD-10-CM | POA: Insufficient documentation

## 2024-09-30 DIAGNOSIS — L500 Allergic urticaria: Secondary | ICD-10-CM | POA: Diagnosis not present

## 2024-09-30 DIAGNOSIS — K5790 Diverticulosis of intestine, part unspecified, without perforation or abscess without bleeding: Secondary | ICD-10-CM | POA: Insufficient documentation

## 2024-09-30 DIAGNOSIS — E785 Hyperlipidemia, unspecified: Secondary | ICD-10-CM | POA: Insufficient documentation

## 2024-09-30 DIAGNOSIS — Z860101 Personal history of adenomatous and serrated colon polyps: Secondary | ICD-10-CM | POA: Insufficient documentation

## 2024-09-30 DIAGNOSIS — N809 Endometriosis, unspecified: Secondary | ICD-10-CM | POA: Insufficient documentation

## 2024-09-30 DIAGNOSIS — R12 Heartburn: Secondary | ICD-10-CM | POA: Insufficient documentation

## 2024-09-30 MED ORDER — CETIRIZINE HCL 10 MG PO TABS: 10.0000 mg | ORAL_TABLET | Freq: Every day | ORAL | 1 refills | Status: AC | PRN

## 2024-09-30 MED ORDER — ALBUTEROL SULFATE HFA 108 (90 BASE) MCG/ACT IN AERS: 1.0000 | INHALATION_SPRAY | Freq: Four times a day (QID) | RESPIRATORY_TRACT | 1 refills | Status: AC | PRN

## 2024-09-30 MED ORDER — FLUTICASONE FUROATE-VILANTEROL 100-25 MCG/ACT IN AEPB: INHALATION_SPRAY | RESPIRATORY_TRACT | 5 refills | Status: AC

## 2024-09-30 NOTE — Patient Instructions (Addendum)
 Mild Persistent Asthma: - With respiratory illness or flare ups, start Breo 100-25mcg 1 puff daily for 1-2 weeks.  - Rescue inhaler: Albuterol  2 puffs via spacer or 1 vial via nebulizer every 4-6 hours as needed for respiratory symptoms of cough, shortness of breath, or wheezing Asthma control goals:  Full participation in all desired activities (may need albuterol  before activity) Albuterol  use two times or less a week on average (not counting use with activity) Cough interfering with sleep two times or less a month Oral steroids no more than once a year No hospitalizations  Other Allergic Rhinitis: - Use nasal saline rinses before nose sprays such as with Neilmed Sinus Rinse.  Use distilled water.   - Use Zyrtec 10 mg daily as needed for runny nose, sneezing, itchy eyes.   Food Allergy:  - please strictly avoid parsley, cinnamon, grapefruit. - for SKIN only reaction, okay to take Zyrtec 10 mg every 12 hours as needed - for SKIN + ANY additional symptoms, OR IF concern for LIFE THREATENING reaction = Epipen Autoinjector EpiPen 0.3 mg - If using Epinephrine autoinjector, call 911 or go to the ER.   Food Intolerance - Diarrhea with pork/dairy is an intolerance, not a true food allergy.  Avoid if it causes problems.    Rashes/Swelling: - Please take pictures if this recurs.    Hold all anti-histamines (Xyzal, Allegra, Zyrtec, Claritin, Benadryl, Pepcid) 3 days prior to next visit.  Follow up: 11/4 at 1030 for skin testing     Asma persistente leve:  - Ante enfermedades respiratorias o exacerbaciones, iniciar con Breo 100-25 mcg, 1 inhalacin diaria durante 1-2 semanas.  - Inhalador de rescate: Albuterol , 2 inhalaciones con espaciador o 1 vial con nebulizador cada 4-6 horas segn sea necesario para los sntomas respiratorios de tos, dificultad para respirar o sibilancias.   Objetivos de control del asma:  Participacin plena en todas las actividades deseadas (puede requerirse  albuterol  antes de la [place]).   Uso de albuterol  dos veces o menos por semana en promedio (sin contar el uso durante la [place]).   Tos que interfiere con el sueo dos veces o menos al mes.   Corticosteroides orales no ms de una vez al ao.   Sin hospitalizaciones.  Otras rinitis alrgicas:  - Realizar lavados nasales con solucin salina antes de usar aerosoles nasales como Neilmed Sinus Rinse. Usar agua destilada. - Tome Zyrtec 10 mg diariamente segn sea necesario para la secrecin nasal, los estornudos y la picazn en los ojos.  Alergia alimentaria: - Evite estrictamente el perejil, la canela y el pomelo.  - Si solo presenta una reaccin cutnea, puede tomar Zyrtec 10 mg cada 12 horas segn sea necesario.  - Si presenta una reaccin cutnea con cualquier otro sntoma, o si le preocupa una reaccin que ponga en peligro su vida, use un autoinyector de epinefrina (EpiPen) de 0.3 mg.  - Si usa  un autoinyector de epinefrina, llame al 911 o acuda a urgencias.  Intolerancia alimentaria:  - La diarrea causada por la carne de cerdo o los lcteos es una intolerancia, no una [place]. Evtela si le causa molestias.  Erupciones/Hinchazn:  - Si reaparece, tome fotos.  Suspenda todos los antihistamnicos (Xyzal, Allegra, Zyrtec, Claritin, Benadryl, Pepcid) 3 das antes de su prxima visita. Seguimiento: 11/4 a las 10:30 para prueba cutnea 1-55

## 2024-09-30 NOTE — Progress Notes (Signed)
 NEW PATIENT  Date of Service/Encounter:  09/30/24  Consult requested by: Job Lukes, [place]   Subjective:   Cheryl Weber (DOB: 1956-04-11) is a 68 y.o. female who presents to the clinic on 09/30/2024 with a chief complaint of Allergies (Has had allergies for years. Issues with asthma while living in [place] country. Had to ue her epi pen for a parsley allergic reaction. 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 ) and Asthma (In July had to use an inhaler and nebulizer. Also been hospitalized twice.) .    History obtained from: chart review and patient.  Interpretor present.  Asthma:  Diagnosed around age 45-5.   Used to have trouble when she was not in the [place]  but now doing better.   Seen by Pulm recently and was controlled on PRN Breo.   She had a flare up in January and July where she had frequent coughing and dyspnea. Took Breo for a few months and then it resolves.  Now only uses the Breo PRN.   Using rescue inhaler: not much recently. Limitations to daily activity: none 1-2 ED visits/UC visits and 1 oral steroids in the past year 2 number of lifetime hospitalizations, 0 number of lifetime intubations.  Identified Triggers: scents, illness, allergies Prior PFTs or spirometry: none Previously used therapies: other inhalers, not sure which ones  Current regimen:  Maintenance: Breo 100-25mcg 1 puff daily  Rescue: Albuterol  2 puffs q4-6 hrs PRN  Rhinitis:  Started since she was little.  Symptoms include: nasal congestion, rhinorrhea, and post nasal drainage, itchy watery eyes  Occurs seasonally-Spring/Fall  Potential triggers: pollens/flowers   Treatments tried:  Azelastine /Ipratropium/Zyrtec PRN Irritable with steroids   Previous allergy testing: yes about 20 years ago.  History of sinus surgery: no Nonallergic triggers: none    Rashes: Reports breaking out in rashes and having swelling around eyes and chest.  Happened a few  times.  Does not have pictures. It does get itchy.  Was told by another doctor, it was not anything worrisome.   Concern for Food Allergy:  Parsley, cinnamon, grapefruit positive and avoids it  Knows dairy/pork intolerance causes diarrhea.    Reviewed:  03/01/2024: seen by Dr Meade Carder for asthma and allergic rhinitis on PRN Breo, Zyrtec, Azelastine .    05/18/2023: seen by Job PA for anxiety, HTN, shoulder pain, LUQ pain. On Zoloft  and Amlodipine .  05/2023: AEC 100 Past Medical History: Past Medical History:  Diagnosis Date   Allergy    Anxiety    Asthma    GERD (gastroesophageal reflux disease)    Hypertension    Vaginal delivery 1968, 1981   Past Surgical History: Past Surgical History:  Procedure Laterality Date   APPENDECTOMY     CESAREAN SECTION  1985   HYSTERECTOMY ABDOMINAL WITH SALPINGECTOMY     TONSILLECTOMY      Family History: Family History  Problem Relation Age of Onset   Transient ischemic attack Mother    Coronary artery disease Mother    Asthma Mother    Lung cancer Father    Cirrhosis Father    Alcohol abuse Father    Asthma Child    Environmental Allergies Child     Social History:  Flooring in bedroom: laminate Tobacco use/exposure: smoked 30 years ago, smoked 3-4 years about 1ppd  Medication List:  Allergies as of 09/30/2024       Reactions   Parsley Seed Dermatitis, Itching, Shortness Of Breath  Gluten Meal Other (See Comments)   Other Other (See Comments)   Pork Allergy Diarrhea   Tilactase Diarrhea        Medication List        Accurate as of September 30, 2024  2:55 PM. If you have any questions, ask your nurse or doctor.          albuterol  108 (90 Base) MCG/ACT inhaler Commonly known as: VENTOLIN  HFA TAKE 2 PUFFS BY MOUTH EVERY 6 HOURS AS NEEDED FOR WHEEZE OR SHORTNESS OF BREATH   amLODipine  5 MG tablet Commonly known as: NORVASC  TAKE 1 TABLET (5 MG TOTAL) BY MOUTH DAILY.   azelastine  0.1 % nasal spray Commonly  known as: ASTELIN  Place 1 spray into both nostrils 2 (two) times daily. Use in each nostril as directed   cetirizine 10 MG tablet Commonly known as: ZYRTEC Take 10 mg by mouth daily.   DHEA PO Take by mouth daily.   EPINEPHrine 0.3 mg/0.3 mL Soaj injection Commonly known as: EPI-PEN Inject 0.3 mg into the muscle as needed.   fluticasone  furoate-vilanterol 100-25 MCG/ACT Aepb Commonly known as: Breo Ellipta  Inhale 1 puff into the lungs daily.   ipratropium 0.06 % nasal spray Commonly known as: ATROVENT  3 (three) times daily.   MAGNESIUM PO Take 50 mg by mouth daily in the afternoon.   MELATONIN PO Take 1 capsule by mouth at bedtime as needed.   prednisoLONE acetate 1 % ophthalmic suspension Commonly known as: PRED FORTE Place 1 drop into both eyes 4 (four) times daily.   sertraline  25 MG tablet Commonly known as: ZOLOFT  TAKE 1 TABLET (25 MG TOTAL) BY MOUTH DAILY.   SLEEP AID PO Take 1 tablet by mouth at bedtime as needed.   VITAMIN C PO Take by mouth daily.   vitamin D3 50 MCG (2000 UT) Caps Take 1 capsule by mouth daily in the afternoon.         REVIEW OF SYSTEMS: Pertinent positives and negatives discussed in HPI.   Objective:   Physical Exam: BP 120/76 (BP Location: Right Arm, Patient Position: Sitting, Cuff Size: Normal)   Pulse 83   Temp 98 F (36.7 C) (Temporal)   Ht 5' 3.5 (1.613 m)   Wt 175 lb 3.2 oz (79.5 kg)   SpO2 98%   BMI 30.55 kg/m  Body mass index is 30.55 kg/m. GEN: alert, well developed HEENT: clear conjunctiva, nose with + mild inferior turbinate hypertrophy, pink nasal mucosa, slight clear rhinorrhea, + cobblestoning HEART: regular rate and rhythm, no murmur LUNGS: clear to auscultation bilaterally, no coughing, unlabored respiration ABDOMEN: soft, non distended  SKIN: no rashes or lesions  Spirometry:  Tracings reviewed. Her effort: Good reproducible efforts. FVC: 2.8L, 103% predicted FEV1: 2.4L, 112% predicted FEV1/FVC  ratio: 86% Interpretation: Spirometry consistent with normal pattern.  Please see scanned spirometry results for details.   Assessment:   1. Other allergic rhinitis   2. 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 Mild persistent asthma without complication   4. Rash and nonspecific skin eruption     Plan/Recommendations:   Mild Persistent Asthma: - Controlled, Spirometry today was normal  - With respiratory illness or flare ups, start Breo 100-18mcg 1 puff daily for 1-2 weeks.  - Rescue inhaler: Albuterol  2 puffs via spacer or 1 vial via nebulizer every 4-6 hours as needed for respiratory symptoms of cough, shortness of breath, or wheezing Asthma control goals:  Full participation in all desired activities (may need albuterol  before  activity) Albuterol  use two times or less a week on average (not counting use with activity) Cough interfering with sleep two times or less a month Oral steroids no more than once a year No hospitalizations   Other Allergic Rhinitis: - Due to turbinate hypertrophy, seasonal symptoms, asthma and unresponsive to over the counter meds, will perform skin testing to identify aeroallergen triggers.   - Use nasal saline rinses before nose sprays such as with Neilmed Sinus Rinse.  Use distilled water.   - Use Zyrtec 10 mg daily as needed for runny nose, sneezing, itchy eyes.   Food Allergy:  - please strictly avoid parsley, cinnamon, grapefruit. - initial rxn: parsley caused hives; others were positive on testing in [place]   - for SKIN only reaction, okay to take Zyrtec 10 mg every 12 hours as needed - for SKIN + ANY additional symptoms, OR IF concern for LIFE THREATENING reaction = Epipen Autoinjector EpiPen 0.3 mg - If using Epinephrine autoinjector, call 911 or go to the ER.   Food Intolerance - Diarrhea with pork/dairy is an intolerance, not a true food allergy.  Avoid if it causes problems.    Rashes/Swelling: - Possibly  hives/angioedema? - Please take pictures if this recurs.    Hold all anti-histamines (Xyzal, Allegra, Zyrtec, Claritin, Benadryl, Pepcid) 3 days prior to next visit.  Follow up: 11/4 at 1030 for skin testing 1-55, no IDs    Arleta Blanch, MD [facility] 

## 2024-10-04 ENCOUNTER — Ambulatory Visit: Admitting: Internal Medicine

## 2024-10-04 ENCOUNTER — Encounter: Payer: Self-pay | Admitting: Internal Medicine

## 2024-10-04 DIAGNOSIS — J3089 Other allergic rhinitis: Secondary | ICD-10-CM

## 2024-10-04 DIAGNOSIS — J4530 Mild persistent asthma, uncomplicated: Secondary | ICD-10-CM | POA: Diagnosis not present

## 2024-10-04 MED ORDER — FLUTICASONE PROPIONATE 50 MCG/ACT NA SUSP: 2.0000 | Freq: Every day | NASAL | 5 refills | Status: AC

## 2024-10-04 NOTE — Progress Notes (Signed)
 FOLLOW UP Date of Service/Encounter:  10/04/24   Subjective:  Cheryl Weber (DOB: [birth year]) is a 68 y.o. female who [facility] for follow up for skin testing.   History obtained from: chart review and patient.  Anti histamines held.   Past Medical History: Past Medical History:  Diagnosis Date   Allergy    Anxiety    Asthma    GERD (gastroesophageal reflux disease)    Hypertension    Vaginal delivery 1968, 1981    Objective:  There were no vitals taken for this visit. There is no height or weight on file to calculate BMI. Physical Exam: GEN: alert, well developed HEENT: clear conjunctiva, MMM LUNGS: unlabored respiration  Skin Testing:  Skin prick testing was placed, which includes aeroallergens/foods, histamine control, and saline control.  Verbal consent was obtained prior to placing test.  Patient tolerated procedure well.  Allergy testing results were read and interpreted by myself, documented by clinical staff. Adequate positive and negative control.  Positive results to:  Results discussed with patient/family.  Airborne Adult Perc - 10/04/24 1038     Time Antigen Placed 1038    Allergen Manufacturer Jestine    Location Back    Number of Test 55    2. Control-Histamine 2+    3. Bahia Negative    4. Bermuda Negative    5. Johnson Negative    6. [place]  Blue Negative    7. Meadow Fescue Negative    8. Perennial Rye Negative    9. Timothy Negative    10. Ragweed Mix Negative    11. Cocklebur Negative    12. Plantain,  English Negative    13. Baccharis Negative    14. Dog Fennel Negative    15. Russian Thistle Negative    16. Lamb's Quarters Negative    17. Sheep Sorrell Negative    18. Rough Pigweed Negative    19. Marsh Elder, Rough Negative    20. Mugwort, Common Negative    21. Box, Elder Negative    22. Cedar, red Negative    23. Sweet Gum Negative    24. Pecan Pollen Negative    25.  Pine Mix Negative    26. Walnut, Black Pollen Negative    27. Red Mulberry Negative    28. Ash Mix Negative    29. Birch Mix Negative    30. Beech American Negative    31. Cottonwood, Eastern Negative    32. Hickory, White Negative    33. Maple Mix Negative    34. Oak, Eastern Mix Negative    35. Sycamore Eastern Negative    36. Alternaria Alternata Negative    37. Cladosporium Herbarum Negative    38. Aspergillus Mix Negative    39. Penicillium Mix Negative    40. Bipolaris Sorokiniana (Helminthosporium) Negative    41. Drechslera Spicifera (Curvularia) Negative    42. Mucor Plumbeus Negative    43. Fusarium Moniliforme Negative    44. Aureobasidium Pullulans (pullulara) Negative    45. Rhizopus Oryzae Negative    46. Botrytis Cinera Negative    47. Epicoccum Nigrum Negative    48. Phoma Betae Negative    49. Dust Mite Mix Negative    50. Cat Hair 10,000 BAU/ml Negative    51.  Dog Epithelia Negative    52. Mixed Feathers Negative    53. Horse Epithelia Negative    54. Cockroach, German Negative  55. Tobacco Leaf Negative           Assessment:   1. Other allergic rhinitis   2. Mild persistent asthma without complication     Plan/Recommendations:   Other Allergic Rhinitis: - Due to turbinate hypertrophy, seasonal symptoms, asthma and unresponsive to over the counter meds, will perform skin testing to identify aeroallergen triggers.   - Positive skin test 10/2024: none - Use nasal saline rinses before nose sprays such as with Neilmed Sinus Rinse.  Use distilled water.   - Use Flonase  2 sprays each nostril daily. Aim upward and outward. - Use Zyrtec 10 mg daily as needed for runny nose, sneezing, itchy watery eyes.   Mild Persistent Asthma: - With respiratory illness or flare ups, start Breo 100-25mcg 1 puff daily for 1-2 weeks.  - Rescue inhaler: Albuterol  2 puffs via spacer or 1 vial via nebulizer every 4-6 hours as needed for respiratory symptoms of cough,  shortness of breath, or wheezing Asthma control goals:  Full participation in all desired activities (may need albuterol  before activity) Albuterol  use two times or less a week on average (not counting use with activity) Cough interfering with sleep two times or less a month Oral steroids no more than once a year No hospitalizations  Food Allergy:  - please strictly avoid parsley.  Okay to eat cinnamon, grapefruit. - initial rxn: parsley caused hives; others were positive on testing in [place]   - sIgE 08/2204: negative to cinnamon and grapefruit; parsley is pending - for SKIN only reaction, okay to take Zyrtec 10 mg every 12 hours as needed - for SKIN + ANY additional symptoms, OR IF concern for LIFE THREATENING reaction = Epipen Autoinjector EpiPen 0.3 mg - If using Epinephrine autoinjector, call 911 or go to the ER.    Food Intolerance - Diarrhea with pork/dairy is an intolerance, not a true food allergy.  Avoid if it causes problems.     Rashes/Swelling: - Possibly hives/angioedema? - Please take pictures if this recurs.     Return in about 4 months (around 02/01/2025).  Arleta Blanch, MD [facility] 

## 2024-10-04 NOTE — Patient Instructions (Addendum)
 Chronic Rhinitis: - Positive skin test 10/2024: none - Use nasal saline rinses before nose sprays such as with Neilmed Sinus Rinse.  Use distilled water.   - Use Flonase  2 sprays each nostril daily. Aim upward and outward. - Use Zyrtec 10 mg daily as needed for runny nose, sneezing, itchy watery eyes.   Mild Persistent Asthma: - With respiratory illness or flare ups, start Breo 100-25mcg 1 puff daily for 1-2 weeks.  - Rescue inhaler: Albuterol  2 puffs via spacer or 1 vial via nebulizer every 4-6 hours as needed for respiratory symptoms of cough, shortness of breath, or wheezing Asthma control goals:  Full participation in all desired activities (may need albuterol  before activity) Albuterol  use two times or less a week on average (not counting use with activity) Cough interfering with sleep two times or less a month Oral steroids no more than once a year No hospitalizations  Food Allergy:  - please strictly avoid parsley.  Okay to eat cinnamon, grapefruit. - for SKIN only reaction, okay to take Zyrtec 10 mg every 12 hours as needed - for SKIN + ANY additional symptoms, OR IF concern for LIFE THREATENING reaction = Epipen Autoinjector EpiPen 0.3 mg - If using Epinephrine autoinjector, call 911 or go to the ER.    Food Intolerance - Diarrhea with pork/dairy is an intolerance, not a true food allergy.  Avoid if it causes problems.     Rashes/Swelling: - Possibly hives/angioedema? - Please take pictures if this recurs.    Rinitis crnica:  - Prueba cutnea positiva (noviembre de 2025): ninguna  - Realice lavados nasales con solucin salina antes de usar aerosoles nasales como Neilmed Sinus Rinse. Use agua destilada.  - Aplique Flonase  dos veces al da en cada fosa nasal. Dirija cleo spina y [place] afuera.  - Tome Zyrtec 10 mg al da segn sea necesario para la secrecin nasal, los estornudos y el picor y lagrimeo de los ojos.  Asma persistente leve:  - En caso de enfermedad  respiratoria o exacerbaciones, comience con Breo 100-25 mcg, una inhalacin al da durante 1 a 2 semanas. Inhalador de rescate: Albuterol , 2 inhalaciones con espaciador o 1 vial con nebulizador cada 4-6 horas segn sea necesario para los sntomas respiratorios de tos, dificultad para respirar o sibilancias.  Objetivos para el control del asma:  Participacin plena en todas las actividades deseadas (puede necesitar albuterol  antes de la [place]).   Uso de albuterol  dos veces o menos por semana en promedio (sin contar el uso durante la [place]).   Tos que interfiere con el sueo dos veces o menos al mes.   Corticoides orales no ms de una vez al ao.   Sin hospitalizaciones.  Alergia alimentaria:  - Evite estrictamente el perejil. Puede consumir canela y toronja.  - Para reacciones cutneas nicamente, puede tomar Zyrtec 10 mg cada 12 horas segn sea necesario.  - Para reacciones cutneas con cualquier otro sntoma, o si existe preocupacin por una reaccin potencialmente mortal: Autoinyector de epinefrina (EpiPen) de 0.3 mg.  - Si usa  un autoinyector de epinefrina, llame al 911 o acuda a urgencias.  Intolerancia alimentaria  - La diarrea causada por la carne de cerdo o los lcteos es una intolerancia, no una alergia [place]. Evtela si le causa molestias.  Erupciones/Hinchazn:  - Posiblemente urticaria o angioedema?  - Si esto se repite, tome fotos.

## 2024-10-05 ENCOUNTER — Ambulatory Visit: Payer: Self-pay | Admitting: Internal Medicine

## 2024-10-05 LAB — F086-IGE PARSLEY: F086-IgE Parsley: <0.1 kU/L

## 2024-10-05 LAB — ALLERGEN, GRAPEFRUIT, F209: Allergen Grapefruit IgE: <0.1 kU/L

## 2024-10-05 LAB — ALLERGEN, CINNAMON, RF220: Allergen Cinnamon IgE: <0.1 kU/L

## 2024-10-06 NOTE — Telephone Encounter (Signed)
 Tried calling both numbers received recording call can not be completed as dialed please try your clal later. ( Patient needs to be informed of labs - parejil, canela and toronja0

## 2024-10-15 ENCOUNTER — Other Ambulatory Visit: Payer: Self-pay | Admitting: Physician Assistant

## 2024-10-19 NOTE — Progress Notes (Signed)
 Spoke to patient she had daughter explain my chart message. She already tried cinnamon no issues, but she is scared to try parsley due hives with consumption. I advised she can continue to avoid or reintroduce cooked. She will continue avoidance. No other questions or concerns

## 2024-11-01 ENCOUNTER — Telehealth: Payer: Self-pay | Admitting: *Deleted

## 2024-11-01 NOTE — Telephone Encounter (Signed)
 Copied from CRM #[id number]. Topic: Appointments - Transfer of Care >> Nov 01, 2024  1:02 PM Shereese L wrote: Pt is requesting to transfer FROM: Job Pt is requesting to transfer TO: Jordan Reason for requested transfer: Preferred to transfer care It is the responsibility of the team the patient would like to transfer to (Dr. jordan) to reach out to the patient if for any reason this transfer is not acceptable.

## 2024-11-01 NOTE — Telephone Encounter (Signed)
 Fine with me

## 2025-02-01 ENCOUNTER — Ambulatory Visit: Admitting: Family

## 2025-02-10 ENCOUNTER — Encounter: Admitting: Family Medicine
